# Patient Record
Sex: Female | Born: 1955 | Race: White | Hispanic: No | Marital: Married | Smoking: Never smoker
Health system: Southern US, Community
[De-identification: ages and names within clinical notes are randomized; demographics above are authoritative.]

## PROBLEM LIST (undated history)

## (undated) DIAGNOSIS — M1990 Unspecified osteoarthritis, unspecified site: Secondary | ICD-10-CM

## (undated) DIAGNOSIS — R079 Chest pain, unspecified: Secondary | ICD-10-CM

## (undated) DIAGNOSIS — E785 Hyperlipidemia, unspecified: Secondary | ICD-10-CM

## (undated) DIAGNOSIS — R51 Headache: Secondary | ICD-10-CM

## (undated) DIAGNOSIS — R002 Palpitations: Secondary | ICD-10-CM

## (undated) DIAGNOSIS — K589 Irritable bowel syndrome without diarrhea: Secondary | ICD-10-CM

## (undated) DIAGNOSIS — M8580 Other specified disorders of bone density and structure, unspecified site: Secondary | ICD-10-CM

## (undated) DIAGNOSIS — IMO0001 Reserved for inherently not codable concepts without codable children: Secondary | ICD-10-CM

## (undated) HISTORY — DX: Palpitations: R00.2

## (undated) HISTORY — DX: Other specified disorders of bone density and structure, unspecified site: M85.80

## (undated) HISTORY — DX: Headache: R51

## (undated) HISTORY — DX: Chest pain, unspecified: R07.9

## (undated) HISTORY — DX: Irritable bowel syndrome, unspecified: K58.9

## (undated) HISTORY — DX: Hyperlipidemia, unspecified: E78.5

## (undated) HISTORY — DX: Reserved for inherently not codable concepts without codable children: IMO0001

---

## 1990-06-07 HISTORY — PX: TONSILLECTOMY: SHX5217

## 1992-06-07 HISTORY — PX: KNEE ARTHROSCOPY: SHX127

## 1997-12-16 ENCOUNTER — Other Ambulatory Visit: Admission: RE | Admit: 1997-12-16 | Discharge: 1997-12-16 | Payer: Self-pay | Admitting: Obstetrics and Gynecology

## 1999-09-01 ENCOUNTER — Encounter: Payer: Self-pay | Admitting: Family Medicine

## 1999-09-01 ENCOUNTER — Encounter: Admission: RE | Admit: 1999-09-01 | Discharge: 1999-09-01 | Payer: Self-pay | Admitting: Family Medicine

## 1999-12-16 ENCOUNTER — Other Ambulatory Visit: Admission: RE | Admit: 1999-12-16 | Discharge: 1999-12-16 | Payer: Self-pay | Admitting: Obstetrics and Gynecology

## 2001-01-20 ENCOUNTER — Other Ambulatory Visit: Admission: RE | Admit: 2001-01-20 | Discharge: 2001-01-20 | Payer: Self-pay | Admitting: Obstetrics and Gynecology

## 2003-03-20 ENCOUNTER — Other Ambulatory Visit: Admission: RE | Admit: 2003-03-20 | Discharge: 2003-03-20 | Payer: Self-pay | Admitting: Obstetrics and Gynecology

## 2004-10-02 ENCOUNTER — Other Ambulatory Visit: Admission: RE | Admit: 2004-10-02 | Discharge: 2004-10-02 | Payer: Self-pay | Admitting: Obstetrics and Gynecology

## 2004-11-11 ENCOUNTER — Ambulatory Visit: Payer: Self-pay | Admitting: Internal Medicine

## 2004-12-11 ENCOUNTER — Encounter (INDEPENDENT_AMBULATORY_CARE_PROVIDER_SITE_OTHER): Payer: Self-pay | Admitting: *Deleted

## 2004-12-11 ENCOUNTER — Ambulatory Visit: Payer: Self-pay | Admitting: Internal Medicine

## 2004-12-11 ENCOUNTER — Encounter: Payer: Self-pay | Admitting: Gastroenterology

## 2007-03-08 LAB — CONVERTED CEMR LAB: Pap Smear: NORMAL

## 2008-04-07 DIAGNOSIS — IMO0001 Reserved for inherently not codable concepts without codable children: Secondary | ICD-10-CM

## 2008-04-07 HISTORY — DX: Reserved for inherently not codable concepts without codable children: IMO0001

## 2008-04-16 ENCOUNTER — Ambulatory Visit: Payer: Self-pay | Admitting: Internal Medicine

## 2008-04-16 DIAGNOSIS — R51 Headache: Secondary | ICD-10-CM | POA: Insufficient documentation

## 2008-04-16 DIAGNOSIS — M949 Disorder of cartilage, unspecified: Secondary | ICD-10-CM

## 2008-04-16 DIAGNOSIS — R519 Headache, unspecified: Secondary | ICD-10-CM | POA: Insufficient documentation

## 2008-04-16 DIAGNOSIS — M899 Disorder of bone, unspecified: Secondary | ICD-10-CM | POA: Insufficient documentation

## 2008-04-22 ENCOUNTER — Telehealth: Payer: Self-pay | Admitting: Internal Medicine

## 2008-04-24 ENCOUNTER — Ambulatory Visit (HOSPITAL_BASED_OUTPATIENT_CLINIC_OR_DEPARTMENT_OTHER): Admission: RE | Admit: 2008-04-24 | Discharge: 2008-04-24 | Payer: Self-pay | Admitting: Internal Medicine

## 2008-04-24 ENCOUNTER — Telehealth (INDEPENDENT_AMBULATORY_CARE_PROVIDER_SITE_OTHER): Payer: Self-pay | Admitting: *Deleted

## 2008-05-14 ENCOUNTER — Ambulatory Visit: Payer: Self-pay | Admitting: Internal Medicine

## 2008-05-15 LAB — CONVERTED CEMR LAB
Albumin: 3.8 g/dL (ref 3.5–5.2)
Alkaline Phosphatase: 43 units/L (ref 39–117)
BUN: 12 mg/dL (ref 6–23)
Basophils Relative: 0.2 % (ref 0.0–3.0)
Calcium: 9 mg/dL (ref 8.4–10.5)
Creatinine, Ser: 0.6 mg/dL (ref 0.4–1.2)
Direct LDL: 193.6 mg/dL
Eosinophils Absolute: 0.1 10*3/uL (ref 0.0–0.7)
Eosinophils Relative: 1.7 % (ref 0.0–5.0)
GFR calc Af Amer: 134 mL/min
GFR calc non Af Amer: 111 mL/min
Glucose, Bld: 89 mg/dL (ref 70–99)
HCT: 35.3 % — ABNORMAL LOW (ref 36.0–46.0)
Hemoglobin: 12.1 g/dL (ref 12.0–15.0)
MCV: 96.7 fL (ref 78.0–100.0)
Monocytes Absolute: 0.4 10*3/uL (ref 0.1–1.0)
Monocytes Relative: 9.7 % (ref 3.0–12.0)
Neutro Abs: 1.5 10*3/uL (ref 1.4–7.7)
Platelets: 193 10*3/uL (ref 150–400)
Total CHOL/HDL Ratio: 3.5
Total Protein: 6.5 g/dL (ref 6.0–8.3)
Triglycerides: 99 mg/dL (ref 0–149)
WBC: 3.8 10*3/uL — ABNORMAL LOW (ref 4.5–10.5)

## 2008-05-21 ENCOUNTER — Ambulatory Visit: Payer: Self-pay | Admitting: Internal Medicine

## 2008-05-21 DIAGNOSIS — E785 Hyperlipidemia, unspecified: Secondary | ICD-10-CM | POA: Insufficient documentation

## 2008-05-21 DIAGNOSIS — D649 Anemia, unspecified: Secondary | ICD-10-CM | POA: Insufficient documentation

## 2008-08-04 ENCOUNTER — Ambulatory Visit: Payer: Self-pay | Admitting: Occupational Medicine

## 2008-08-12 ENCOUNTER — Ambulatory Visit: Payer: Self-pay | Admitting: Internal Medicine

## 2008-08-12 DIAGNOSIS — H6120 Impacted cerumen, unspecified ear: Secondary | ICD-10-CM | POA: Insufficient documentation

## 2008-09-03 ENCOUNTER — Ambulatory Visit: Payer: Self-pay | Admitting: Internal Medicine

## 2008-09-03 LAB — CONVERTED CEMR LAB
Cholesterol: 287 mg/dL — ABNORMAL HIGH (ref 0–200)
Direct LDL: 191.8 mg/dL
Iron: 107 ug/dL (ref 42–145)
Saturation Ratios: 29.7 % (ref 20.0–50.0)
Total CHOL/HDL Ratio: 4
Transferrin: 257.3 mg/dL (ref 212.0–360.0)
Triglycerides: 131 mg/dL (ref 0.0–149.0)

## 2008-09-20 ENCOUNTER — Ambulatory Visit: Payer: Self-pay | Admitting: Internal Medicine

## 2009-03-12 ENCOUNTER — Emergency Department (HOSPITAL_COMMUNITY): Admission: EM | Admit: 2009-03-12 | Discharge: 2009-03-12 | Payer: Self-pay | Admitting: Emergency Medicine

## 2009-05-20 ENCOUNTER — Encounter (INDEPENDENT_AMBULATORY_CARE_PROVIDER_SITE_OTHER): Payer: Self-pay | Admitting: Obstetrics and Gynecology

## 2009-05-20 ENCOUNTER — Ambulatory Visit (HOSPITAL_COMMUNITY): Admission: RE | Admit: 2009-05-20 | Discharge: 2009-05-20 | Payer: Self-pay | Admitting: Obstetrics and Gynecology

## 2009-05-20 ENCOUNTER — Ambulatory Visit: Payer: Self-pay | Admitting: Vascular Surgery

## 2009-06-24 ENCOUNTER — Ambulatory Visit: Payer: Self-pay | Admitting: Diagnostic Radiology

## 2009-06-24 ENCOUNTER — Emergency Department (HOSPITAL_BASED_OUTPATIENT_CLINIC_OR_DEPARTMENT_OTHER): Admission: EM | Admit: 2009-06-24 | Discharge: 2009-06-24 | Payer: Self-pay | Admitting: Emergency Medicine

## 2009-07-15 ENCOUNTER — Encounter: Admission: RE | Admit: 2009-07-15 | Discharge: 2009-09-23 | Payer: Self-pay | Admitting: Internal Medicine

## 2009-11-11 ENCOUNTER — Encounter: Payer: Self-pay | Admitting: Internal Medicine

## 2009-11-18 ENCOUNTER — Encounter (INDEPENDENT_AMBULATORY_CARE_PROVIDER_SITE_OTHER): Payer: Self-pay | Admitting: *Deleted

## 2010-01-02 ENCOUNTER — Telehealth: Payer: Self-pay | Admitting: Internal Medicine

## 2010-02-24 ENCOUNTER — Ambulatory Visit: Payer: Self-pay | Admitting: Internal Medicine

## 2010-02-24 DIAGNOSIS — R198 Other specified symptoms and signs involving the digestive system and abdomen: Secondary | ICD-10-CM | POA: Insufficient documentation

## 2010-02-24 DIAGNOSIS — R0602 Shortness of breath: Secondary | ICD-10-CM | POA: Insufficient documentation

## 2010-02-24 DIAGNOSIS — M2550 Pain in unspecified joint: Secondary | ICD-10-CM | POA: Insufficient documentation

## 2010-02-24 DIAGNOSIS — R197 Diarrhea, unspecified: Secondary | ICD-10-CM | POA: Insufficient documentation

## 2010-02-27 ENCOUNTER — Ambulatory Visit: Payer: Self-pay | Admitting: Diagnostic Radiology

## 2010-02-27 ENCOUNTER — Telehealth: Payer: Self-pay | Admitting: Internal Medicine

## 2010-02-27 ENCOUNTER — Ambulatory Visit (HOSPITAL_BASED_OUTPATIENT_CLINIC_OR_DEPARTMENT_OTHER): Admission: RE | Admit: 2010-02-27 | Discharge: 2010-02-27 | Payer: Self-pay | Admitting: Internal Medicine

## 2010-03-03 ENCOUNTER — Encounter: Payer: Self-pay | Admitting: Internal Medicine

## 2010-03-03 LAB — CONVERTED CEMR LAB
Albumin: 4.7 g/dL (ref 3.5–5.2)
Anti Nuclear Antibody(ANA): POSITIVE — AB
BUN: 10 mg/dL (ref 6–23)
Basophils Absolute: 0 10*3/uL (ref 0.0–0.1)
Basophils Relative: 0 % (ref 0–1)
Calcium: 9.6 mg/dL (ref 8.4–10.5)
Eosinophils Absolute: 0.1 10*3/uL (ref 0.0–0.7)
Free T4: 0.99 ng/dL (ref 0.80–1.80)
Glucose, Bld: 90 mg/dL (ref 70–99)
Hemoglobin: 13.2 g/dL (ref 12.0–15.0)
MCHC: 33.2 g/dL (ref 30.0–36.0)
MCV: 98.3 fL (ref 78.0–100.0)
Monocytes Absolute: 0.4 10*3/uL (ref 0.1–1.0)
Monocytes Relative: 9 % (ref 3–12)
Neutrophils Relative %: 45 % (ref 43–77)
RBC: 4.04 M/uL (ref 3.87–5.11)
RDW: 12.5 % (ref 11.5–15.5)
Rhuematoid fact SerPl-aCnc: <20 intl units/mL (ref 0–20)
TSH: 1.722 microintl units/mL (ref 0.350–4.500)
Total Bilirubin: 0.6 mg/dL (ref 0.3–1.2)

## 2010-03-06 ENCOUNTER — Telehealth: Payer: Self-pay | Admitting: Internal Medicine

## 2010-03-16 ENCOUNTER — Encounter: Payer: Self-pay | Admitting: Internal Medicine

## 2010-04-02 ENCOUNTER — Ambulatory Visit: Payer: Self-pay | Admitting: Internal Medicine

## 2010-05-19 ENCOUNTER — Encounter: Payer: Self-pay | Admitting: Nurse Practitioner

## 2010-05-22 ENCOUNTER — Telehealth: Payer: Self-pay | Admitting: Internal Medicine

## 2010-05-22 ENCOUNTER — Encounter: Payer: Self-pay | Admitting: Nurse Practitioner

## 2010-05-26 ENCOUNTER — Ambulatory Visit: Payer: Self-pay | Admitting: Gastroenterology

## 2010-05-26 DIAGNOSIS — R141 Gas pain: Secondary | ICD-10-CM | POA: Insufficient documentation

## 2010-05-26 DIAGNOSIS — R143 Flatulence: Secondary | ICD-10-CM

## 2010-05-26 DIAGNOSIS — R142 Eructation: Secondary | ICD-10-CM

## 2010-06-03 ENCOUNTER — Ambulatory Visit
Admission: RE | Admit: 2010-06-03 | Discharge: 2010-06-03 | Payer: Self-pay | Source: Home / Self Care | Attending: Family | Admitting: Family

## 2010-06-29 DIAGNOSIS — Z860100 Personal history of colon polyps, unspecified: Secondary | ICD-10-CM | POA: Insufficient documentation

## 2010-06-29 DIAGNOSIS — K5730 Diverticulosis of large intestine without perforation or abscess without bleeding: Secondary | ICD-10-CM | POA: Insufficient documentation

## 2010-07-03 ENCOUNTER — Ambulatory Visit
Admission: RE | Admit: 2010-07-03 | Discharge: 2010-07-03 | Payer: Self-pay | Source: Home / Self Care | Attending: Internal Medicine | Admitting: Internal Medicine

## 2010-07-06 ENCOUNTER — Other Ambulatory Visit: Payer: Self-pay | Admitting: Internal Medicine

## 2010-07-06 DIAGNOSIS — R140 Abdominal distension (gaseous): Secondary | ICD-10-CM

## 2010-07-06 DIAGNOSIS — R1013 Epigastric pain: Secondary | ICD-10-CM

## 2010-07-07 NOTE — Progress Notes (Signed)
Summary: triage  Phone Note Call from Patient Call back at [phone]   Caller: Patient Call For: Dr. Juanda Chance Reason for Call: Talk to Nurse Summary of Call: would like to be worked in for abd pain... ok to see Amy or Gunnar Fusi Initial call taken by: Vallarie Mare,  January 02, 2010 12:10 PM  Follow-up for Phone Call        Pt c/o abd off and on for one month.  Worse this week.  Would like to be seen.  APpt sch for pt to see Amy Esterwood on 01/07/10. Follow-up by: Ashok Cordia RN,  January 02, 2010 2:59 PM

## 2010-07-07 NOTE — Letter (Signed)
Summary: [facility]  [facility]   Imported By: Lanelle Bal 12/03/2009 08:50:06  _____________________________________________________________________  External Attachment:    Type:   Image     Comment:   External Document

## 2010-07-07 NOTE — Assessment & Plan Note (Signed)
Summary: fatigue short of breath, cough/mhf--Rm 3   Vital Signs:  Patient profile:   55 year old female Height:      64 inches Weight:      142.50 pounds BMI:     24.55 O2 Sat:      99 % on Room air Temp:     98.2 degrees F oral Pulse rate:   66 / minute Pulse rhythm:   regular Resp:     16 per minute BP sitting:   122 / 80  (right arm) Cuff size:   regular  Vitals Entered By: Mervin Kung CMA Duncan Dull) (February 24, 2010 3:19 PM)  O2 Flow:  Room air CC: Rm 3  Pt states she is having intermittent shorness of breath and fatigue x 2 months. Is Patient Diabetic? No   Primary Care Provider:  Dondra Spry DO  CC:  Rm 3  Pt states she is having intermittent shorness of breath and fatigue x 2 months..  History of Present Illness: 55 y/o white female with multiple complaints over the last 2 months -  55 feels poorly  dry cough,  can't get deep breath "feeling mild pressure" she thought breathing issue was related to abd issues  some indigestion - which is better stopped all vitamins intermittent diarrhea.  when she has diarrhea - it can be 6 x per day she can exp rectal irritation even after she finishes BM, she feels like she has to go again knot in her throat,  no overt heartburn a couple of times,  she noticed "vapor coming out of her mouth" always feels full tried prilosec x 2 weeks -  fullness got better  over the winter - noticed rash on left side of neck while experiencing diarrhea.   Current Diet Breakfast: higher fiber cereral fruit, milk   boiled egg,   Lunch:  salad, with [place] meat,  mandarin oranged DInner:  salmon, broccoli and rice.   pizza 2 times per month Snacks: trail mix Beverage:  mostly water,  green tea with stevia, hot tea at night   Preventive Screening-Counseling & Management  Alcohol-Tobacco     Smoking Status: never  Allergies: 1)  ! Sulfa  Past History:  Past Medical History: Headache Normal MRI of Brain - 04/2008 Osteopenia     elevated cholesterol decreased vit d   Past Surgical History: Left knee orthoscopic surgery 1994 Tonsillectomy 1992    FH reviewed for relevance  Family History: Hypertension-mother Fibromyalgia-mother Osteoarthritis-mother Graves' disease- paternal aunt Father side the family with history of autoimmune diseases. brother alive and healthy     Review of Systems  The patient denies fever.         pt c/o generalized achy joints no oral ulcers no facial rash  Physical Exam  General:  alert, well-developed, and well-nourished.   Head:  normocephalic and atraumatic.   Eyes:  pupils equal, pupils round, and pupils reactive to light.   Ears:  R ear normal and L ear normal.   Mouth:  pharynx pink and moist.  no oral lesions Neck:  supple and no masses.  no adenopathy Lungs:  normal respiratory effort and normal breath sounds.   Heart:  normal rate, regular rhythm, and no gallop.   Abdomen:  diffuse tenderness,  soft, no masses, no hepatomegaly, and no splenomegaly.   Msk:  normal ROM, no joint swelling, no joint warmth, and no redness over joints.   Extremities:  No lower extremity edema Neurologic:  cranial nerves  II-XII intact and gait normal.   Psych:  normally interactive, good eye contact, not anxious appearing, and not depressed appearing.     Impression & Recommendations:  Problem # 1:  SHORTNESS OF BREATH (ICD-786.05) SOB of unclear etiology .  check labs. if initial testing is normal.  consider check PFTs Orders: T-Basic Metabolic Panel [id number]) T-CBC w/Diff [id number]) T-2 View CXR, Same Day (71020.5TC) T-BNP  (B Natriuretic Peptide) ([id number]) EKG w/ Interpretation (93000)  Problem # 2:  DIARRHEA (ICD-787.91) chronic intermittent loose stools.  I doubt IBD. refer to GI for further eval Orders: T-Hepatic Function [id number]) T-Sed Rate (Automated) ([phone] [phone]) T-TSH [phone]) T-T4, Free [id number]) Gastroenterology Referral  (GI)  Problem # 3:  ARTHRALGIA (ICD-719.40) Assessment: New  Orders: T-Antinuclear Antib (ANA) [phone]) T-Rheumatoid Factor [id number])  Problem # 4:  EARLY SATIETY (ICD-789.9)  Orders: Gastroenterology Referral (GI)  Complete Medication List: 1)  Calcium/vit D  .... 1200/1000 two times a day 2)  Vitamin E  .... 1,000 mg once a day 3)  Omega 3.69  .... 1000mg  once a day 4)  Estradiol 0.5 Mg Tabs (Estradiol) .... Take 1 tablet by mouth once a day. 5)  Prometrium 100 Mg Caps (Progesterone micronized) .... Take 1 tab by mouth at bedtime. 6)  Mobic 15 Mg Tabs (Meloxicam) .... As needed. 7)  Omeprazole 40 Mg Cpdr (Omeprazole) .... One by mouth once daily 15-20 minutes before am meal 8)  Welchol 625 Mg Tabs (Colesevelam hcl) .... One by mouth three times a day as needed diarrhea  Patient Instructions: 1)  Please schedule a follow-up appointment in 4 weeks. 2)  Decrease caffeine intake Prescriptions: OMEPRAZOLE 40 MG CPDR (OMEPRAZOLE) one by mouth once daily 15-20 minutes before AM meal  #30 x 3   Entered and Authorized by:   D. Thomos Lemons DO   Signed by:   D. Thomos Lemons DO on 02/24/2010   Method used:   Electronically to        [id number] (retail)       [address] [place], [place]  [address]       Ph: [phone]       Fax: [phone]   RxID:   [id number]   Current Allergies (reviewed today): ! SULFA

## 2010-07-07 NOTE — Assessment & Plan Note (Signed)
Summary: 1 month follow up/mhf   Vital Signs:  Patient profile:   55 year old female Height:      64 inches Weight:      140.50 pounds BMI:     24.20 O2 Sat:      99 % on Room air Temp:     97.9 degrees F oral Pulse rate:   59 / minute Pulse rhythm:   regular Resp:     16 per minute BP sitting:   116 / 70  (right arm) Cuff size:   regular  Vitals Entered By: Glendell Docker CMA (April 02, 2010 3:01 PM)  O2 Flow:  Room air CC: 1 Month Follow up  Is Patient Diabetic? No Pain Assessment Patient in pain? no      Comments discuss vitamin D, and rheumatology visit, flu shot to be given with employer   Primary Care Megon Kalina:  D. Thomos Lemons DO  CC:  1 Month Follow up .  History of Present Illness: 55 year old white female for followup.  Patient seen by rheumatology due to  positive ANA and multiple arthralgias. Patient reports rheumatologic workup negative Her symptoms improved with eliminating gluten from her diet She was tested for celiac sprue, her antibodies were negative She declines referral for EGD for small bowel biopsy  she also reported dry eyes and dry mouth Sjgren's antibodies were negative  Preventive Screening-Counseling & Management  Alcohol-Tobacco     Smoking Status: never  Allergies: 1)  ! Sulfa  Past History:  Past Medical History: Headache Normal MRI of Brain - 04/2008 Osteopenia    elevated cholesterol low vit d   Past Surgical History: Left knee orthoscopic surgery 1994 Tonsillectomy 1992      Family History: Hypertension-mother Fibromyalgia-mother Osteoarthritis-mother Graves' disease- paternal aunt Father side the family with history of autoimmune diseases. 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   Lungs:  normal respiratory effort and normal breath sounds.   Heart:  normal rate, regular rhythm, and no gallop.   Neurologic:  cranial nerves II-XII intact and gait normal.     Impression & Recommendations:  Problem # 1:  DIARRHEA (ICD-787.91) Assessment Improved pt reports loose stools improved after starting gluten free diet.  pt seen by rheum.  sprue antibodies reported negative  Problem # 2:  ARTHRALGIA (ICD-719.40) Assessment: Improved arthralgia also improved with gluten free diet. she defers EGD for small bowel biopsy  Complete Medication List: 1)  Calcium/vit D  .... 1200/1000 two times a day 2)  Estradiol 0.5 Mg Tabs (Estradiol) .... Take 1 tablet by mouth once a day. 3)  Prometrium 100 Mg Caps (Progesterone micronized) .... Take 1 tab by mouth at bedtime. 4)  Mobic 15 Mg Tabs (Meloxicam) .... As needed.  Patient Instructions: 1)  Please schedule a follow-up appointment in 1 year.   Orders Added: 1)  Est. Patient Level III [14782]    Current Allergies (reviewed today): ! SULFA    Preventive Care Screening  Bone Density:    Date:  07/21/2009    Results:  abnormal std dev  Mammogram:    Date:  07/21/2009    Results:  normal   Pap Smear:    Date:  05/26/2009  Results:  normal

## 2010-07-07 NOTE — Consult Note (Signed)
Summary: [facility]   Imported By: Maryln Gottron 04/03/2010 09:50:01  _____________________________________________________________________  External Attachment:    Type:   Image     Comment:   External Document

## 2010-07-07 NOTE — Letter (Signed)
Summary: Colonoscopy Date Change Letter  [facility]  [address] [place], [place] [address]   Phone: [phone]  Fax: [phone]      November 18, 2009 MRN: [id number]   [facility] [place] [facility] [address] [place], [place]  [address]   Dear Ms. Pracht,   Previously you were recommended to have a repeat colonoscopy around this time. Your chart was recently reviewed by Dr. Hedwig Morton. Juanda Chance of [facility]. Follow up colonoscopy is now recommended in July 2013. This revised recommendation is based on current, nationally recognized guidelines for colorectal cancer screening and polyp surveillance. These guidelines are endorsed by the American Cancer Society, [employer] on Colorectal Cancer as well as numerous other major medical organizations.  Please understand that our recommendation assumes that you do not have any new symptoms such as bleeding, a change in bowel habits, anemia, or significant abdominal discomfort. If you do have any concerning GI symptoms or want to discuss the guideline recommendations, please call to arrange an office visit at your earliest convenience. Otherwise we will keep you in our reminder system and contact you 1-2 months prior to the date listed above to schedule your next colonoscopy.  Thank you,  Hedwig Morton. Juanda Chance, M.D.  [facility] [phone]

## 2010-07-07 NOTE — Progress Notes (Signed)
Summary: Xray results  Phone Note Outgoing Call   Summary of Call: call pt - chest x ray is normal Initial call taken by: D. Thomos Lemons DO,  February 27, 2010 4:23 PM  Follow-up for Phone Call        call placed to patient  [phone], she hs been advised per Dr Artist Pais instructions Follow-up by: Glendell Docker CMA,  February 27, 2010 5:02 PM

## 2010-07-07 NOTE — Progress Notes (Signed)
Summary: Lab Results  Phone Note Outgoing Call   Summary of Call: call pt - blood work shows ANA is positive.  I suggest referral to rheumatologist Initial call taken by: D. Thomos Lemons DO,  March 06, 2010 1:20 PM  Follow-up for Phone Call        call placed to patient at [phone], she has been advised per Dr Artist Pais instructions Follow-up by: Glendell Docker CMA,  March 06, 2010 1:53 PM

## 2010-07-09 NOTE — Assessment & Plan Note (Signed)
Summary: CONGESTION SORE THROAT/MHF--Rm 3   Vital Signs:  Patient profile:   54 year old female Height:      64 inches Weight:      140.25 pounds BMI:     24.16 O2 Sat:      100 % on Room air Temp:     97.0 degrees F oral Pulse rate:   66 / minute Pulse rhythm:   regular Resp:     18 per minute BP sitting:   130 / 88  (right arm) Cuff size:   regular  Vitals Entered By: Mervin Kung CMA Duncan Dull) (June 03, 2010 9:44 AM)  O2 Flow:  Room air CC: Pt states she has had sore throat and sinus drainage since Monday. Has tried throat lozenges and salt water gargle without relief. Is Patient Diabetic? No Pain Assessment Patient in pain? no      Comments Pt states she is now on Vit D 50,000 units once a week x 6 weeks. Nicki Guadalajara Fergerson CMA Duncan Dull)  June 03, 2010 9:50 AM    Primary Care Provider:  Dondra Spry DO  CC:  Pt states she has had sore throat and sinus drainage since Monday. Has tried throat lozenges and salt water gargle without relief..  History of Present Illness: Pt is a 55 year old female who presents with complaing of sore throat x 48 hours.  She also notes + nasal drainage since last night- clear. Feels achey.  Mild cough- worse at night.  Has taken zinc lozenges, salt water gargles without improvement.   Throat feels raw.  Denies HA or ear pain.  Denies known sick contacts at home but had some sick contacts at work.    Allergies: 1)  ! Sulfa  Past History:  Past Medical History: Last updated: 04/02/2010 Headache Normal MRI of Brain - 04/2008 Osteopenia    elevated cholesterol low vit d   Past Surgical History: Last updated: 04/02/2010 Left knee orthoscopic surgery 1994 Tonsillectomy 1992      Review of Systems       see HPI  Physical Exam  General:  Well-developed,well-nourished,in no acute distress; alert,appropriate and cooperative throughout examination Head:  Normocephalic and atraumatic without obvious abnormalities. No apparent  alopecia or balding. Ears:  External ear exam shows no significant lesions or deformities.  Otoscopic examination reveals partially occluded canals due to cerumen, tympanic membranes are intact bilaterally without bulging, retraction, inflammation or discharge. Hearing is grossly normal bilaterally. Mouth:  tonsils are surgically absent.  Mild erythema without exudates noted Neck:  No deformities, masses, or tenderness noted. Lungs:  Normal respiratory effort, chest expands symmetrically. Lungs are clear to auscultation, no crackles or wheezes. Heart:  Normal rate and regular rhythm. S1 and S2 normal without gallop, murmur, click, rub or other extra sounds. Cervical Nodes:  no anterior cervical adenopathy.     Impression & Recommendations:  Problem # 1:  VIRAL URI (ICD-465.9) Assessment New Rapid strep is negative.  Symptoms most consistent with viral URI.  Recommended supportive measures as outlined in pt sign out sheet.  Pt instructed not to use motrin and mobic together.  She verbalizes understanding.  Her updated medication list for this problem includes:    Mobic 15 Mg Tabs (Meloxicam) .Marland Kitchen... As needed.  Complete Medication List: 1)  Calcium/vit D  .... 1200/1000 two times a day 2)  Estradiol 0.5 Mg Tabs (Estradiol) .... Take 1 tablet by mouth once a day. 3)  Prometrium 100 Mg Caps (Progesterone micronized) .Marland KitchenMarland KitchenMarland Kitchen  Take 1 tab by mouth at bedtime. 4)  Mobic 15 Mg Tabs (Meloxicam) .... As needed. 5)  Align Caps (Probiotic product) .... Take 1 capsule daily  Patient Instructions: 1)  Gargle twice daily with salt water. 2)  Take Ibuprofen every 6 hours as needed for pain 3)  You may use over the counter Cepacol lozenges or Chloraseptic spray as needed for sore throat. 4)  Call if your symptoms worsen, or if you are not feeling better in 48-72 hours.   Orders Added: 1)  Est. Patient Level III [64403]    Current Allergies (reviewed today): ! SULFA  Appended Document: CONGESTION SORE  THROAT/MHF--Rm 3    Clinical Lists Changes  Orders: Added new Service order of Rapid Strep [phone]) - Signed Observations: Added new observation of RAPID STREP: negative (06/03/2010 10:23)      Laboratory Results   Date/Time Reported: Mervin Kung CMA (AAMA)  June 03, 2010 10:23 AM   Other Tests  Rapid Strep: negative  Kit Test Internal QC: Positive   (Normal Range: Negative)

## 2010-07-09 NOTE — Procedures (Signed)
Summary: COLON   Colonoscopy  Procedure date:  12/11/2004  Findings:      Location:  [facility].    Procedures Next Due Date:    Colonoscopy: 12/2009 Patient Name: Brooke Bright, Brooke Bright MRN:  Procedure Procedures: Colonoscopy CPT: 09811.    with biopsy. CPT: Q5068410.  Personnel: Endoscopist: Zynasia Burklow L. Juanda Chance, MD.  Referred By: Richardean Chimera, MD.  Exam Location: Exam performed in Outpatient Clinic. Outpatient  Patient Consent: Procedure, Alternatives, Risks and Benefits discussed, consent obtained, from patient. Consent was obtained by the RN.  Indications  Average Risk Screening Routine.  History  Current Medications: Patient is not currently taking Coumadin.  Pre-Exam Physical: Performed Dec 11, 2004. Entire physical exam was normal.  Exam Exam: Extent of exam reached: Cecum, extent intended: Cecum.  The cecum was identified by appendiceal orifice and IC valve. Colon retroflexion performed. Images taken. ASA Classification: I. Tolerance: good.  Monitoring: Pulse and BP monitoring, Oximetry used. Supplemental O2 given.  Colon Prep Used Miralax for colon prep. Prep results: good.  Sedation Meds: Patient assessed and found to be appropriate for moderate (conscious) sedation. Fentanyl 100 mcg. given IV. Versed 10 mg. given IV.  Findings - NORMAL EXAM: Cecum.  - DIVERTICULOSIS: Sigmoid Colon. ICD9: Diverticulosis: 562.10. Comments: several diverticuli seen.  POLYP: Rectum, Maximum size: 3 mm. diminutive, sessile polyp. Distance from Anus 5 cm. Procedure:  biopsy without cautery, removed, retrieved, Polyp sent to pathology. ICD9: Colon Polyps: 211.3.  - NORMAL EXAM: Rectum.   Assessment Abnormal examination, see findings above.  Diagnoses: 211.3: Colon Polyps.  562.10: Diverticulosis.   Comments: diminutive polyp ablated Events  Unplanned Interventions: No intervention was required.  Unplanned Events: There were no  complications. Plans Medication Plan: Await pathology.  Patient Education: Patient given standard instructions for: Yearly hemoccult testing recommended. Patient instructed to get routine colonoscopy every 5-7 years.  Comments: recall colonoscopy interval will depend on the polyp pathology ( hyperplastic vs other) Disposition: After procedure patient sent to recovery. After recovery patient sent home.   This report was created from the original endoscopy report, which was reviewed and signed by the above listed endoscopist.

## 2010-07-09 NOTE — Assessment & Plan Note (Signed)
Summary: frequent BMs, loose stool/Brooke Bright   History of Present Illness Visit Type: Initial Consult Primary GI MD: Melvia Heaps MD [facility] Primary Provider: Dondra Spry DO Requesting Provider: Cathlean Sauer, MD Chief Complaint: Intermittant episodes of watery loose diarrhea. Pt stopped eating gluten food products and this has helped with her diarrhea. Pt states with the episodes she has heartburn, reflux and anal discomfort.  History of Present Illness:   Brooke Bright is a 55 year old female who had a screening colonoscopy with Dr. Arlyce Dice in 2006. Brooke Bright believes she has celiac disease and would like to discuss diagnosis and treatment. Brooke Bright descrbes a several year history of intermittent bowel habit changes. During these "bouts" her stool consistency varies from  loose to more of a "cow patty". Last summer Brooke Bright developed several problems including early satiety, feeling of a lump in her throat, pruritis /upper body rashes, fatigue and polyarthralgias.  She was found to have a positive ANA and was referred to a rheumatologist. Her rheumatology workup was negative but suggestion was made to try a gluten-free diet.  Brooke Bright had previously been tested for celiac disease by her GYN and labs were negative. After her discussion with rheumatology Brooke Bright decided to eliminate gluten from her diet and many of her symptoms subsided. Dr. Artist Pais gave her Prilosec which helped the early satiety and globus sensation. He recommended Brooke Bright see [place] for small bowel biopsies but Brooke Bright postponed making an appointment since she was feeling better.  Compared to last summer Brooke Bright feels a whole lot better but still has "bad days" when she feels bloatedhas burning in her rectum and a feeling of incomplete evacuation. Occasionally has RLQ pain and right lower back pain. She doesn't know if RLQ pain is referred pain from her back or vice versa.             GI Review of Systems    Reports abdominal pain, acid reflux,  bloating, and  chest pain.     Location of  Abdominal pain: right sided.    Denies dysphagia with liquids, dysphagia with solids, heartburn, loss of appetite, nausea, vomiting, vomiting blood, weight loss, and  weight gain.      Reports change in bowel habits, diarrhea, and  rectal pain.     Denies anal fissure, black tarry stools, constipation, diverticulosis, fecal incontinence, heme positive stool, hemorrhoids, irritable bowel syndrome, jaundice, light color stool, liver problems, and  rectal bleeding.    Current Medications (verified): 1)  Calcium/vit D .... 1200/1000 Two Times A Day 2)  Estradiol 0.5 Mg Tabs (Estradiol) .... Take 1 Tablet By Mouth Once A Day. 3)  Prometrium 100 Mg Caps (Progesterone Micronized) .... Take 1 Tab By Mouth At Bedtime. 4)  Mobic 15 Mg Tabs (Meloxicam) .... As Needed.  Allergies (verified): 1)  ! Sulfa  Past History:  Past Medical History: Reviewed history from 04/02/2010 and no changes required. Headache Normal MRI of Brain - 04/2008 Osteopenia    elevated cholesterol low vit d   Past Surgical History: Reviewed history from 04/02/2010 and no changes required. Left knee orthoscopic surgery 1994 Tonsillectomy 1992      Family History: Reviewed history from 04/02/2010 and no changes required. Hypertension-mother Fibromyalgia-mother Osteoarthritis-mother Graves' disease- paternal aunt Father side the family with history of autoimmune diseases. brother alive and healthy      Social History: Reviewed history from 04/02/2010 and no changes required. 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  The Brooke Bright denies allergy/sinus, anemia,  anxiety-new, blood in urine, breast changes/lumps, change in vision, confusion, cough, coughing up blood, depression-new, fainting, fever, headaches-new, hearing problems, heart murmur, heart rhythm changes, itching, menstrual pain, night sweats, nosebleeds, pregnancy symptoms, skin rash, sleeping problems, sore throat, swelling of feet/legs, swollen lymph glands, thirst - excessive , urination - excessive , urination changes/pain, urine leakage, vision changes, and voice change.    Vital Signs:  Brooke Bright profile:   55 year old female Height:      64 inches Weight:      139 pounds BMI:     23.95 Pulse rate:   66 / minute Pulse rhythm:   regular BP sitting:   126 / 74  (left arm) Cuff size:   regular  Vitals Entered By: Christie Nottingham CMA Duncan Dull) (May 26, 2010 3:32 PM)  Physical Exam  General:  Well developed, well nourished, no acute distress. Head:  Normocephalic and atraumatic. Eyes:  Conjunctiva pink, no icterus.  Mouth:  No oral lesions. Tongue moist.  Neck:  no obvious masses  Lungs:  Clear throughout to auscultation. Heart:  Regular rate and rhythm; no murmurs, rubs,  or bruits. Abdomen:  Abdomen soft, nontender, nondistended. No obvious masses or hepatomegaly.Normal bowel sounds.  Msk:  Symmetrical with no gross deformities. Normal posture. Extremities:  No clubbing, cyanosis, edema or deformities noted. Neurologic:  Alert and  oriented x4;  grossly normal neurologically. Skin:  Intact without significant lesions or rashes. Cervical Nodes:  No significant cervical adenopathy. Psych:  Alert and cooperative. Normal mood and affect.   Impression & Recommendations:  Problem # 1:  DIARRHEA (ICD-787.91) Loose stool, bloating, and fatigue, all markedly improved with gluten free diet. Apparently labs for celiac disease done by gynecologist were negative. Still symptomatic anywhere from 1-3 times a week despite strict gluten-free diet. She may have underlying IBS. Although  not absolutely necessary, Brooke Bright has no history of iron deficiency, her B12 and folate are normal, no significant weight loss.  She understands that management would be the same if she did in fact have celiac disease and that to be tested she would need to resume intake of gluten for at least 6 weeks. At this point in time Brooke Bright doesn't want to resume gluten for fear of having a recurrence of severe symptoms. If her symptoms progress Brooke Bright will further evaluation. Follow up with Dr. Juanda Chance in 4-5 weeks.  Problem # 2:  FLATULENCE-GAS-BLOATING (ICD-787.3) Assessment: Comment Only See #`1. Trial of Align  Brooke Bright Instructions: 1)  Take 1 Align capsule daily for 1 month. Samples given. 2)  We made you an appointment with Dr. Juanda Chance for 07-03-2010 at 3:30 PM.   3)  Copy sent to : D. Thomos Lemons, MD 4)  The medication list was reviewed and reconciled.  All changed / newly prescribed medications were explained.  A complete medication list was provided to the Brooke Bright / caregiver.

## 2010-07-09 NOTE — Letter (Signed)
Summary: New Patient letter  [facility]  [address] [place], [place] [address]   Phone: [phone]  Fax: [phone]       05/22/2010 MRN: [id number]  [facility] [address] [place], [place]  [address]  Dear [name],  Welcome to the Gastroenterology Division at [facility].    You are scheduled to see Brooke Cluster, RN on 05/26/10 at 3:30 PM on the 3rd floor at [facility], [address].  We ask that you try to arrive at our office 15 minutes prior to your appointment time to allow for check-in.  We would like you to complete the enclosed self-administered evaluation form prior to your visit and bring it with you on the day of your appointment.  We will review it with you.  Also, please bring a complete list of all your medications or, if you prefer, bring the medication bottles and we will list them.  Please bring your insurance card so that we may make a copy of it.  If your insurance requires a referral to see a specialist, please bring your referral form from your primary care physician.  Co-payments are due at the time of your visit and may be paid by cash, check or credit card.     Your office visit will consist of a consult with your physician (includes a physical exam), any laboratory testing he/she may order, scheduling of any necessary diagnostic testing (e.g. x-ray, ultrasound, CT-scan), and scheduling of a procedure (e.g. Endoscopy, Colonoscopy) if required.  Please allow enough time on your schedule to allow for any/all of these possibilities.    If you cannot keep your appointment, please call [phone] to cancel or reschedule prior to your appointment date.  This allows [place] the opportunity to schedule an appointment for another patient in need of care.  If you do not cancel or reschedule by 5 p.m. the business day prior to your appointment date, you will be charged a $50.00 late cancellation/no-show fee.    Thank you  for choosing [facility] for your medical needs.  We appreciate the opportunity to care for you.  Please visit [place] at our website  to learn more about our practice.                     Sincerely,                                                             The Gastroenterology Division

## 2010-07-09 NOTE — Letter (Signed)
Summary: [facility]   Imported By: Lester [place] 06/11/2010 09:45:49  _____________________________________________________________________  External Attachment:    Type:   Image     Comment:   External Document

## 2010-07-09 NOTE — Progress Notes (Signed)
Summary: triage  Phone Note From Other Clinic Call back at [phone]   Caller: Clydie Braun, scheduler Call For: Dr. Juanda Chance Reason for Call: Schedule Patient Appt Summary of Call: [name] would like pt seen for loose stools and frequent BMs... office notes being faxed to [facility] Initial call taken by: Vallarie Mare,  May 22, 2010 9:16 AM  Follow-up for Phone Call        Left message for Clydie Braun to call me to set up the appointment. Jesse Fall RN  May 22, 2010 9:58 AM Spoke with patient. Scheduled her on 05/26/10 at 3:30 PM with Willette Cluster, RNP.  Spoke with Clydie Braun at Dr. Lisbeth Ply office. She will fax patient records. .Letter mailed to patient. Follow-up by: Jesse Fall RN,  May 22, 2010 10:43 AM

## 2010-07-14 ENCOUNTER — Ambulatory Visit (HOSPITAL_COMMUNITY)
Admission: RE | Admit: 2010-07-14 | Discharge: 2010-07-14 | Disposition: A | Discharge status: Home / Self Care | Payer: 59 | Source: Ambulatory Visit | Attending: Internal Medicine | Admitting: Internal Medicine

## 2010-07-14 DIAGNOSIS — R140 Abdominal distension (gaseous): Secondary | ICD-10-CM

## 2010-07-14 DIAGNOSIS — R1013 Epigastric pain: Secondary | ICD-10-CM

## 2010-07-14 DIAGNOSIS — K838 Other specified diseases of biliary tract: Secondary | ICD-10-CM | POA: Insufficient documentation

## 2010-07-14 DIAGNOSIS — R142 Eructation: Secondary | ICD-10-CM | POA: Insufficient documentation

## 2010-07-14 DIAGNOSIS — R143 Flatulence: Secondary | ICD-10-CM | POA: Insufficient documentation

## 2010-07-14 DIAGNOSIS — R141 Gas pain: Secondary | ICD-10-CM | POA: Insufficient documentation

## 2010-07-14 DIAGNOSIS — K3189 Other diseases of stomach and duodenum: Secondary | ICD-10-CM | POA: Insufficient documentation

## 2010-07-15 ENCOUNTER — Telehealth: Payer: Self-pay | Admitting: Internal Medicine

## 2010-07-15 NOTE — Assessment & Plan Note (Signed)
Summary: F/U Frequent BM's, loose stools, saw Lafe Garin ACNP   History of Present Illness Primary GI MD: Lina Sar MD Primary Provider: Dondra Spry DO Requesting Provider: n/a Chief Complaint: f/u loose stools and bloating. Pt states she started Align which has helped with her bowels. Pt states her bowels have become less frequent and more solid but not completely better. History of Present Illness:   This is a 55 year old white female with questionable celiac disease. She had negative sprue serologies but has had a clinical response to a gluten-free diet. Her diarrhea has subsided on a strict gluten-free diet. She still has remaining dyspepsia and decreased energy. She saw Willette Cluster, NP in December 2012 and was given probiotics. She is interested in further evaluation, specifically an upper endoscopy with small bowel biopsy and possibly upper abdominal ultrasound to rule out symptomatic gallbladder disease.   GI Review of Systems    Reports bloating.      Denies abdominal pain, acid reflux, belching, chest pain, dysphagia with liquids, dysphagia with solids, heartburn, loss of appetite, nausea, vomiting, vomiting blood, weight loss, and  weight gain.      Reports diarrhea.     Denies anal fissure, black tarry stools, change in bowel habit, constipation, diverticulosis, fecal incontinence, heme positive stool, hemorrhoids, irritable bowel syndrome, jaundice, light color stool, liver problems, rectal bleeding, and  rectal pain.    Current Medications (verified): 1)  Calcium/vit D .... 1200/1000 Two Times A Day 2)  Estradiol 0.5 Mg Tabs (Estradiol) .... Take 1 Tablet By Mouth Once A Day. 3)  Prometrium 100 Mg Caps (Progesterone Micronized) .... Take 1 Tab By Mouth At Bedtime. 4)  Mobic 15 Mg Tabs (Meloxicam) .... As Needed. 5)  Align  Caps (Probiotic Product) .... Take 1 Capsule Daily  Allergies (verified): 1)  ! Sulfa  Past History:  Past Medical History: Reviewed history  from 04/02/2010 and no changes required. Headache Normal MRI of Brain - 04/2008 Osteopenia    elevated cholesterol low vit d   Past Surgical History: Reviewed history from 04/02/2010 and no changes required. Left knee orthoscopic surgery 1994 Tonsillectomy 1992      Family History: Reviewed history from 06/29/2010 and no changes required. Hypertension-mother Fibromyalgia-mother Osteoarthritis-mother Graves' disease- paternal aunt Father side the family with history of autoimmune diseases. brother alive and healthy     Family History of Heart Disease: Maternal Grandmother  Social History: Reviewed history from 06/29/2010 and no changes required. 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) Married  for 7 years (second marriage) one daughter 33 years old (college in [place]) Never Smoked Alcohol use-yes -occasionally  Review of Systems  The patient denies allergy/sinus, anemia, anxiety-new, arthritis/joint pain, back pain, blood in urine, breast changes/lumps, change in vision, confusion, cough, coughing up blood, depression-new, fainting, fatigue, fever, headaches-new, hearing problems, heart murmur, heart rhythm changes, itching, menstrual pain, muscle pains/cramps, night sweats, nosebleeds, pregnancy symptoms, shortness of breath, skin rash, sleeping problems, sore throat, swelling of feet/legs, swollen lymph glands, thirst - excessive , urination - excessive , urination changes/pain, urine leakage, vision changes, and voice change.         Pertinent positive and negative review of systems were noted in the above HPI. All other ROS was otherwise negative.   Vital Signs:  Patient profile:   55 year old female Height:      64 inches Weight:      137.25 pounds BMI:     23.64 Pulse rate:  68 / minute Pulse rhythm:   regular BP sitting:   106 / 64  (left arm) Cuff size:   regular  Vitals Entered By: Christie Nottingham CMA Duncan Dull) (July 03, 2010 4:37  PM)  Physical Exam  General:  Well developed, well nourished, no acute distress. Mouth:  tongue papillated Neck:  Supple; no masses or thyromegaly. Lungs:  Clear throughout to auscultation. Heart:  Regular rate and rhythm; no murmurs, rubs,  or bruits. Abdomen:  softer lax abdomen with normoactive bowel sounds. No tenderness. No distention. No abnormal rashes Extremities:  No clubbing, cyanosis, edema or deformities noted. Skin:  there are skin no lesions Psych:  Alert and cooperative. Normal mood and affect.    Impression & Recommendations:  Problem # 1:  COLONIC POLYPS, HYPERPLASTIC, HX OF (ICD-V12.72) Patient had an otherwise normal colonoscopy. A recall colonoscopy will be due in 2013.  Problem # 2:  FLATULENCE-GAS-BLOATING (ICD-787.3)  Patient will have an upper abdominal ultrasound to rule out symptomatic gallbladder disease.  Orders: Ultrasound Abdomen (UAS)  Problem # 3:  ANEMIA, MILD (ICD-285.9) Patient has anemia and osteopenia. We need to rule out sprue. I have asked her  to go back to a diet with gluten and we will then wait 6 weeks and obtain small bowel biopsies to definitively rule out celiac disease.  Patient Instructions: 1)  Stop gluten-free diet and start the a regular diet which includes gluten. In 6 weeks, we will schedule an upper endoscopy with small bowel biopsies. She will call us to schedule that. 2)  You will be scheduled for an upper abdominal ultrasound. 3)  Consider rechecking sprue serologies including tissue transglutaminase, IgA and IgG. 4)  The medication list was reviewed and reconciled.  All changed / newly prescribed medications were explained.  A complete medication list was provided to the patient / caregiver.

## 2010-07-23 NOTE — Progress Notes (Signed)
Summary: Returning your call  Phone Note Call from Patient Call back at Work Phone [phone]   Caller: Patient Call For: Dr. Juanda Chance Summary of Call: pt. said she was returning your call Initial call taken by: Karna Christmas,  July 15, 2010 10:07 AM  Follow-up for Phone Call        Spoke with patient and gave her ultrasound results as per Dr. Juanda Chance. Follow-up by: Jesse Fall RN,  July 15, 2010 10:55 AM

## 2010-12-07 ENCOUNTER — Inpatient Hospital Stay (INDEPENDENT_AMBULATORY_CARE_PROVIDER_SITE_OTHER)
Admission: RE | Admit: 2010-12-07 | Discharge: 2010-12-07 | Disposition: A | Discharge status: Home / Self Care | Payer: 59 | Source: Ambulatory Visit | Attending: Family Medicine | Admitting: Family Medicine

## 2010-12-07 DIAGNOSIS — R071 Chest pain on breathing: Secondary | ICD-10-CM

## 2010-12-07 DIAGNOSIS — M549 Dorsalgia, unspecified: Secondary | ICD-10-CM

## 2010-12-07 LAB — BILIRUBIN, TOTAL: Total Bilirubin: 0.4 mg/dL (ref 0.3–1.2)

## 2010-12-07 LAB — AST: AST: 18 U/L (ref 0–37)

## 2010-12-07 LAB — GAMMA GT: GGT: 13 U/L (ref 7–51)

## 2010-12-07 LAB — ALT: ALT: 13 U/L (ref 0–35)

## 2011-08-06 ENCOUNTER — Ambulatory Visit (INDEPENDENT_AMBULATORY_CARE_PROVIDER_SITE_OTHER): Payer: 59 | Admitting: Internal Medicine

## 2011-08-06 ENCOUNTER — Encounter: Payer: Self-pay | Admitting: Internal Medicine

## 2011-08-06 ENCOUNTER — Telehealth: Payer: Self-pay | Admitting: *Deleted

## 2011-08-06 DIAGNOSIS — M791 Myalgia, unspecified site: Secondary | ICD-10-CM

## 2011-08-06 DIAGNOSIS — H9209 Otalgia, unspecified ear: Secondary | ICD-10-CM

## 2011-08-06 DIAGNOSIS — IMO0001 Reserved for inherently not codable concepts without codable children: Secondary | ICD-10-CM

## 2011-08-06 DIAGNOSIS — E785 Hyperlipidemia, unspecified: Secondary | ICD-10-CM

## 2011-08-06 MED ORDER — FLUTICASONE PROPIONATE 50 MCG/ACT NA SUSP: 2.0000 | Freq: Every day | NASAL | Status: DC

## 2011-08-06 NOTE — Telephone Encounter (Signed)
Opened in error

## 2011-08-06 NOTE — Progress Notes (Signed)
  Subjective:    Patient ID: Brooke Bright, female    DOB: [birth year], 56 y.o.   MRN: [id number]  HPI Pt presents to clinic for evaluation of ear pain. Notes chronic intermittent ear fullness with mild radiating pain to both sides of neck diagonally. No ear drainage, fever or chills. Has chronic msk pain ?myofascial involving neck, trapezius and right lbp without radicular sx's. Massage definitely helps. Takes no regular medication for the problem. Past +ANA evaluated by rheumatology. Reviewed significant hyperlipidemia last evaluated 2011 with ldl 191 and tchol 287. Not interested in medication for chol. Total time of visit 30 mins of which >50% spent in counseling  No past medical history on file. No past surgical history on file.  does not have a smoking history on file. She does not have any smokeless tobacco history on file. Her alcohol and drug histories not on file. family history is not on file. Allergies  Allergen Reactions  . Sulfonamide Derivatives      Review of Systems see hpi    Objective:   Physical Exam  Nursing note and vitals reviewed. Constitutional: She appears well-developed and well-nourished. No distress.  HENT:  Head: Normocephalic and atraumatic.  Right Ear: Tympanic membrane, external ear and ear canal normal.  Left Ear: Tympanic membrane, external ear and ear canal normal.  Nose: Nose normal.  Mouth/Throat: Oropharynx is clear and moist. No oropharyngeal exudate.  Eyes: Conjunctivae are normal. Right eye exhibits no discharge. Left eye exhibits no discharge. No scleral icterus.  Neck: Neck supple. No thyromegaly present.  Lymphadenopathy:    She has no cervical adenopathy.  Neurological: She is alert.  Skin: Skin is warm and dry. She is not diaphoretic.  Psychiatric: She has a normal mood and affect.          Assessment & Plan:

## 2011-08-08 DIAGNOSIS — H9209 Otalgia, unspecified ear: Secondary | ICD-10-CM | POA: Insufficient documentation

## 2011-08-08 DIAGNOSIS — M791 Myalgia, unspecified site: Secondary | ICD-10-CM | POA: Insufficient documentation

## 2011-08-08 NOTE — Assessment & Plan Note (Signed)
Suspect eustachian tube etiology. Attempt short course of decongestant. Begin flonase. Followup if no improvement or worsening.

## 2011-08-08 NOTE — Assessment & Plan Note (Signed)
S/p rheumatology evaluation. Recommend regular exercise. Prescription written for therapeutic massage

## 2011-08-08 NOTE — Assessment & Plan Note (Signed)
Discussed potential contribution to risk of cad untreated. Not currently interested in medication. Asked to consider rechecking lipids in future

## 2012-04-24 ENCOUNTER — Other Ambulatory Visit (HOSPITAL_BASED_OUTPATIENT_CLINIC_OR_DEPARTMENT_OTHER): Payer: Self-pay | Admitting: Rheumatology

## 2012-04-24 DIAGNOSIS — M5416 Radiculopathy, lumbar region: Secondary | ICD-10-CM

## 2012-04-25 ENCOUNTER — Ambulatory Visit (HOSPITAL_BASED_OUTPATIENT_CLINIC_OR_DEPARTMENT_OTHER)
Admission: RE | Admit: 2012-04-25 | Discharge: 2012-04-25 | Disposition: A | Discharge status: Home / Self Care | Payer: 59 | Source: Ambulatory Visit | Attending: Rheumatology | Admitting: Rheumatology

## 2012-04-25 DIAGNOSIS — M5416 Radiculopathy, lumbar region: Secondary | ICD-10-CM

## 2012-04-25 DIAGNOSIS — M51379 Other intervertebral disc degeneration, lumbosacral region without mention of lumbar back pain or lower extremity pain: Secondary | ICD-10-CM | POA: Insufficient documentation

## 2012-04-25 DIAGNOSIS — M5137 Other intervertebral disc degeneration, lumbosacral region: Secondary | ICD-10-CM | POA: Insufficient documentation

## 2012-10-03 ENCOUNTER — Other Ambulatory Visit: Payer: Self-pay | Admitting: Internal Medicine

## 2012-10-04 NOTE — Telephone Encounter (Signed)
Please advise refill? Fluticasone 50 mcg/act. Patients last appointment was on 08/06/11.

## 2013-04-18 ENCOUNTER — Ambulatory Visit: Payer: 59 | Attending: Sports Medicine | Admitting: Rehabilitation

## 2013-04-18 DIAGNOSIS — M25519 Pain in unspecified shoulder: Secondary | ICD-10-CM | POA: Insufficient documentation

## 2013-04-18 DIAGNOSIS — IMO0001 Reserved for inherently not codable concepts without codable children: Secondary | ICD-10-CM | POA: Insufficient documentation

## 2013-04-18 DIAGNOSIS — M542 Cervicalgia: Secondary | ICD-10-CM | POA: Insufficient documentation

## 2013-04-23 ENCOUNTER — Ambulatory Visit: Payer: 59 | Admitting: Rehabilitation

## 2013-04-25 ENCOUNTER — Ambulatory Visit: Payer: 59 | Admitting: Rehabilitation

## 2013-04-30 ENCOUNTER — Ambulatory Visit: Payer: 59 | Admitting: Rehabilitation

## 2013-05-02 ENCOUNTER — Ambulatory Visit: Payer: 59 | Admitting: Rehabilitation

## 2013-05-16 ENCOUNTER — Ambulatory Visit: Payer: 59 | Attending: Sports Medicine | Admitting: Rehabilitation

## 2013-05-16 DIAGNOSIS — M542 Cervicalgia: Secondary | ICD-10-CM | POA: Insufficient documentation

## 2013-05-16 DIAGNOSIS — IMO0001 Reserved for inherently not codable concepts without codable children: Secondary | ICD-10-CM | POA: Insufficient documentation

## 2013-05-16 DIAGNOSIS — M25519 Pain in unspecified shoulder: Secondary | ICD-10-CM | POA: Insufficient documentation

## 2014-02-05 ENCOUNTER — Encounter (HOSPITAL_BASED_OUTPATIENT_CLINIC_OR_DEPARTMENT_OTHER): Payer: Self-pay | Admitting: Emergency Medicine

## 2014-02-05 ENCOUNTER — Emergency Department (HOSPITAL_BASED_OUTPATIENT_CLINIC_OR_DEPARTMENT_OTHER): Payer: 59

## 2014-02-05 ENCOUNTER — Emergency Department (HOSPITAL_BASED_OUTPATIENT_CLINIC_OR_DEPARTMENT_OTHER)
Admission: EM | Admit: 2014-02-05 | Discharge: 2014-02-05 | Disposition: A | Discharge status: Home / Self Care | Payer: 59 | Attending: Emergency Medicine | Admitting: Emergency Medicine

## 2014-02-05 DIAGNOSIS — IMO0002 Reserved for concepts with insufficient information to code with codable children: Secondary | ICD-10-CM | POA: Diagnosis not present

## 2014-02-05 DIAGNOSIS — Y93B9 Activity, other involving muscle strengthening exercises: Secondary | ICD-10-CM | POA: Insufficient documentation

## 2014-02-05 DIAGNOSIS — Z8739 Personal history of other diseases of the musculoskeletal system and connective tissue: Secondary | ICD-10-CM | POA: Diagnosis not present

## 2014-02-05 DIAGNOSIS — M545 Low back pain, unspecified: Secondary | ICD-10-CM | POA: Diagnosis present

## 2014-02-05 DIAGNOSIS — X503XXA Overexertion from repetitive movements, initial encounter: Secondary | ICD-10-CM | POA: Insufficient documentation

## 2014-02-05 DIAGNOSIS — X500XXA Overexertion from strenuous movement or load, initial encounter: Secondary | ICD-10-CM | POA: Diagnosis not present

## 2014-02-05 DIAGNOSIS — Z862 Personal history of diseases of the blood and blood-forming organs and certain disorders involving the immune mechanism: Secondary | ICD-10-CM | POA: Insufficient documentation

## 2014-02-05 DIAGNOSIS — S335XXA Sprain of ligaments of lumbar spine, initial encounter: Secondary | ICD-10-CM | POA: Insufficient documentation

## 2014-02-05 DIAGNOSIS — Z79899 Other long term (current) drug therapy: Secondary | ICD-10-CM | POA: Insufficient documentation

## 2014-02-05 DIAGNOSIS — S39012A Strain of muscle, fascia and tendon of lower back, initial encounter: Secondary | ICD-10-CM

## 2014-02-05 DIAGNOSIS — Y929 Unspecified place or not applicable: Secondary | ICD-10-CM | POA: Insufficient documentation

## 2014-02-05 DIAGNOSIS — Z8639 Personal history of other endocrine, nutritional and metabolic disease: Secondary | ICD-10-CM | POA: Insufficient documentation

## 2014-02-05 HISTORY — DX: Unspecified osteoarthritis, unspecified site: M19.90

## 2014-02-05 MED ORDER — MORPHINE SULFATE 4 MG/ML IJ SOLN
4.0000 mg | Freq: Once | INTRAMUSCULAR | Status: AC
  Administered 2014-02-05: 4 mg via INTRAVENOUS
  Filled 2014-02-05: qty 1

## 2014-02-05 MED ORDER — DIAZEPAM 5 MG/ML IJ SOLN
5.0000 mg | Freq: Once | INTRAMUSCULAR | Status: AC
  Administered 2014-02-05: 5 mg via INTRAVENOUS
  Filled 2014-02-05: qty 2

## 2014-02-05 MED ORDER — OXYCODONE-ACETAMINOPHEN 5-325 MG PO TABS: 1.0000 | ORAL_TABLET | Freq: Four times a day (QID) | ORAL | Status: DC | PRN

## 2014-02-05 MED ORDER — CYCLOBENZAPRINE HCL 10 MG PO TABS: 10.0000 mg | ORAL_TABLET | Freq: Two times a day (BID) | ORAL | Status: DC | PRN

## 2014-02-05 MED ORDER — KETOROLAC TROMETHAMINE 30 MG/ML IJ SOLN
30.0000 mg | Freq: Once | INTRAMUSCULAR | Status: AC
  Administered 2014-02-05: 30 mg via INTRAVENOUS
  Filled 2014-02-05: qty 1

## 2014-02-05 NOTE — ED Provider Notes (Signed)
CSN: [id number]     Arrival date & time 02/05/14  1848 History  This chart was scribed for Brooke Canal, MD by Roxy Cedar, ED Scribe. This patient was seen in room MH06/MH06 and the patient's care was started at 7:11 PM.  Chief Complaint  Patient presents with  . Back Pain   The history is provided by the patient. No language interpreter was used.   HPI Comments: Brooke Bright is a 58 y.o. female with a history of osteoarthritis, who presents to the Emergency Department complaining of moderate right sided lower back pain that began earlier today after working out using an exercise ball. She denies associated head injury or numbness in legs bilaterally. She denies injury from a fall. She states that she strained her back during exercise, and that the pain radiates down to sacral area and right leg with movement. Patient denies trouble urinating. Denies numbness.   Past Medical History  Diagnosis Date  . Headache(784.0)   . Normal MRI 04/2008    normal MRI of Brain  . Osteopenia   . Hyperlipidemia   . Vitamin D deficiency     low vitamin D  . Arthritis    Past Surgical History  Procedure Laterality Date  . Knee arthroscopy  1994    left knee  . Tonsillectomy  1992   Family History  Problem Relation Age of Onset  . Hypertension Mother   . Fibromyalgia Mother   . Osteoarthritis Mother   . Graves' disease Paternal Aunt   . Immunodeficiency      auto immune disease-father side of family  . Healthy Brother   . Heart disease Maternal Grandmother    History  Substance Use Topics  . Smoking status: Never Smoker   . Smokeless tobacco: Never Used  . Alcohol Use: Yes   OB History   Grav Para Term Preterm Abortions TAB SAB Ect Mult Living                 Review of Systems  Cardiovascular: Negative for leg swelling.  Genitourinary: Negative for dysuria.  Musculoskeletal: Positive for back pain. Negative for neck pain.  Skin: Negative for wound.  Neurological: Negative  for syncope, light-headedness and numbness.  All other systems reviewed and are negative.  Allergies  Sulfonamide derivatives  Home Medications   Prior to Admission medications   Medication Sig Start Date End Date Taking? Authorizing Provider  cyclobenzaprine (FLEXERIL) 10 MG tablet Take 10 mg by mouth 3 (three) times daily as needed for muscle spasms.   Yes Historical Provider, MD  meloxicam (MOBIC) 15 MG tablet Take 15 mg by mouth once as needed for pain.   Yes Historical Provider, MD  estradiol (ESTRACE) 0.5 MG tablet Take 0.5 mg by mouth daily.    Historical Provider, MD  fluticasone (FLONASE) 50 MCG/ACT nasal spray PLACE 2 SPRAYS INTO THE NOSE AT BEDTIME. 10/03/12   Bradd Canary, MD  progesterone (PROMETRIUM) 100 MG capsule Take 100 mg by mouth daily.    Historical Provider, MD   Triage Vitals: BP 132/84  Pulse 65  Temp(Src) 97.7 F (36.5 C) (Oral)  Resp 18  SpO2 99%  Physical Exam  Nursing note and vitals reviewed. Constitutional: She is oriented to person, place, and time. She appears well-developed and well-nourished. No distress.  HENT:  Head: Normocephalic and atraumatic.  Eyes: Conjunctivae and EOM are normal.  Neck: Neck supple. No tracheal deviation present.  Cardiovascular: Normal rate, regular rhythm and normal heart  sounds.   Pulmonary/Chest: Effort normal and breath sounds normal. No respiratory distress.  Abdominal: Soft. Bowel sounds are normal.  Musculoskeletal: Normal range of motion. She exhibits tenderness.  No midline tenderness. Right para lumbar tenderness.   Neurological: She is alert and oriented to person, place, and time.  Postitive straight leg test on the right. No saddle anesthesia. Nl reflexes. Neurovascular intact lower extremities   Skin: Skin is warm and dry.  Psychiatric: She has a normal mood and affect. Her behavior is normal.    ED Course  Procedures (including critical care time)  DIAGNOSTIC STUDIES: Oxygen Saturation is 99% on  RA, normal by my interpretation.    COORDINATION OF CARE: 7:19 PM- Discussed plans to administer pain medications via IV. Pt advised of plan for treatment and pt agrees.  Labs Review Labs Reviewed - No data to display  Imaging Review Ct Lumbar Spine Wo Contrast  02/05/2014   CLINICAL DATA:  Back injury R leg parethesia. R/o disc protrusion Back injury R leg parethesia. R/o disc protrusion  EXAM: CT LUMBAR SPINE WITHOUT CONTRAST  TECHNIQUE: Multidetector CT imaging of the lumbar spine was performed without intravenous contrast administration. Multiplanar CT image reconstructions were also generated.  COMPARISON:  MR 04/25/2012  FINDINGS: Numbering scheme follows that used on prior study. Normal alignment. Negative for fracture. Mild circumferential disc bulge L2-3. Small Schmorl's node in the superior endplate of L3 as before. Vertebral body and disc height maintained throughout. Mild facet degenerative hypertrophy bilaterally L5-S1. No significant foraminal osseous stenosis. Regional soft tissues unremarkable.  IMPRESSION: 1. Negative for fracture or other acute abnormality. 2. Mild degenerative changes as above, largely stable since previous exam.   Electronically Signed   By: Oley Balm M.D.   On: 02/05/2014 21:16     EKG Interpretation None      MDM   Final diagnoses:  None   Brooke Bright is a 58 y.o. female here with back pain. Neurovascular intact. After morphine, toradol, valium, still had some pain and trouble walking. CT showed no protruded disc and she subsequently was able to bear weight and ambulate with steady gait. I doubt spinal injury. Will d/c with motrin, percocet, prn flexeril.    I personally performed the services described in this documentation, which was scribed in my presence. The recorded information has been reviewed and is accurate.   Brooke Canal, MD 02/05/14 2129

## 2014-02-05 NOTE — ED Notes (Signed)
Working out at gym onset of back pain

## 2014-02-05 NOTE — Discharge Instructions (Signed)
Take motrin 600 mg every 6 hrs for pain.   Take percocet for severe pain. DO NOT drive with it.   Take flexeril for muscle spasms.   Follow up with orthopedic doctor. You may need physical therapy or injections if pain not improved with pain meds.   Avoid strenuous exercise.   Return to ER if you have leg weakness, numbness, trouble urinating.

## 2014-02-05 NOTE — ED Notes (Signed)
EKG requested Per RN Jasmine December due to Heart flutter

## 2014-02-05 NOTE — ED Notes (Signed)
Patient transported to CT 

## 2014-02-05 NOTE — ED Notes (Signed)
pts BP noted. States she has been having intermittent fluttering in her chest. EKG ordered.

## 2014-02-07 ENCOUNTER — Ambulatory Visit (INDEPENDENT_AMBULATORY_CARE_PROVIDER_SITE_OTHER): Payer: 59 | Admitting: Family Medicine

## 2014-02-07 ENCOUNTER — Encounter: Payer: Self-pay | Admitting: Family Medicine

## 2014-02-07 VITALS — BP 121/80 | HR 69 | Ht 65.0 in | Wt 138.0 lb

## 2014-02-07 DIAGNOSIS — M5430 Sciatica, unspecified side: Secondary | ICD-10-CM

## 2014-02-07 DIAGNOSIS — M5441 Lumbago with sciatica, right side: Secondary | ICD-10-CM

## 2014-02-07 MED ORDER — PREDNISONE (PAK) 10 MG PO TABS: ORAL_TABLET | ORAL | Status: DC

## 2014-02-07 MED ORDER — OXYCODONE-ACETAMINOPHEN 5-325 MG PO TABS: 1.0000 | ORAL_TABLET | Freq: Four times a day (QID) | ORAL | Status: DC | PRN

## 2014-02-07 MED ORDER — CYCLOBENZAPRINE HCL 10 MG PO TABS: 10.0000 mg | ORAL_TABLET | Freq: Three times a day (TID) | ORAL | Status: DC | PRN

## 2014-02-07 NOTE — Patient Instructions (Signed)
Your pain is due to severe lumbar strain or radiculopathy from disc herniation. These are treated similarly initially. A prednisone dose pack is the best option for immediate relief and may be prescribed. Day after finishing prednisone start aleve 2 tabs twice a day with food for pain and inflammation. Percocet as needed for severe pain (no driving on this medicine). Flexeril as needed for muscle spasms (no driving on this medicine if it makes you sleepy). Stay as active as possible. Physical therapy has been shown to be helpful as well - will consider if you're improving. Strengthening of low back muscles, abdominal musculature are key for long term pain relief. If not improving, will consider further imaging (MRI). Call me in 1-2 weeks to let me know how you're doing. Out of work through Tuesday.

## 2014-02-08 ENCOUNTER — Encounter: Payer: Self-pay | Admitting: Family Medicine

## 2014-02-08 DIAGNOSIS — M5441 Lumbago with sciatica, right side: Secondary | ICD-10-CM | POA: Insufficient documentation

## 2014-02-08 NOTE — Progress Notes (Addendum)
Patient ID: Brooke Bright, female   DOB: [birth year], 58 y.o.   MRN: [id number]  PCP: Letitia Libra, Ala Dach, MD  Subjective:   HPI: Patient is a 58 y.o. female here for low back pain.  Patient reports she was working out at [employer] on 9/1. Was lying on her back on an exercise ball, contracting abs. Tried to get up from this position and couldn't do so. Pain has intensified since that time - currently with 8/10 pain. Difficulty getting comfortable. Hard to lift right leg, this leg 'feels swollen from the inside out' No numbness or tingling. No bowel/bladder dysfunction. No prior injuries to back.  Past Medical History  Diagnosis Date  . Headache(784.0)   . Normal MRI 04/2008    normal MRI of Brain  . Osteopenia   . Hyperlipidemia   . Vitamin D deficiency     low vitamin D  . Arthritis     Current Outpatient Prescriptions on File Prior to Visit  Medication Sig Dispense Refill  . estradiol (ESTRACE) 0.5 MG tablet Take 0.5 mg by mouth daily.      . fluticasone (FLONASE) 50 MCG/ACT nasal spray PLACE 2 SPRAYS INTO THE NOSE AT BEDTIME.  16 g  6  . meloxicam (MOBIC) 15 MG tablet Take 15 mg by mouth once as needed for pain.      . progesterone (PROMETRIUM) 100 MG capsule Take 100 mg by mouth daily.       No current facility-administered medications on file prior to visit.    Past Surgical History  Procedure Laterality Date  . Knee arthroscopy  1994    left knee  . Tonsillectomy  1992    Allergies  Allergen Reactions  . Sulfonamide Derivatives     History   Social History  . Marital Status: Married    Spouse Name: N/A    Number of Children: N/A  . Years of Education: N/A   Occupational History  . Not on file.   Social History Main Topics  . Smoking status: Never Smoker   . Smokeless tobacco: Never Used  . Alcohol Use: Yes  . Drug Use: No  . Sexual Activity: Not on file   Other Topics Concern  . 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 Hypertension Mother   . Fibromyalgia Mother   . Osteoarthritis Mother   . Graves' disease Paternal Aunt   . Immunodeficiency      auto immune disease-father side of family  . Healthy Brother   . Heart disease Maternal Grandmother     BP 121/80  Pulse 69  Ht  (1.651 m)  Wt 138 lb (62.596 kg)  BMI 22.96 kg/m2  Review of Systems: See HPI above.    Objective:  Physical Exam:  Gen: NAD  Back: No gross deformity, scoliosis. No focal TTP.  No midline or bony TTP. Unable to extend past 5 degrees, flex past 30 degrees due to pain. Strength LEs 5/5 all muscle groups except 4/5 with right hip flexion, painful in back to do this.   2+ MSRs in patellar and achilles tendons, equal bilaterally.  Negative SLRs. Sensation intact to light touch bilaterally. Negative logroll bilateral hips    Assessment & Plan:  1. Low back pain - no tenderness as would suspect with severe lumbar strain.  Had a CT in the ED and while this is not as sensitive for disc herniation, none was seen.  Both treated similarly initially.  Start with prednisone dose pack, percocet and flexeril as needed.  Call in 1-2 weeks to let me know how she's doing - add PT if improving.  MRI if not improving.  Out of work through Tuesday.  Addendum:  MRI reviewed and discussed with patient.  No change from MRI 2 years ago.  No sign of a radiculopathic source.  She is improving and did a little work last week.  Will start physical therapy at this point.  Follow up in 4-6 weeks or as needed.

## 2014-02-08 NOTE — Assessment & Plan Note (Signed)
no tenderness as would suspect with severe lumbar strain.  Had a CT in the ED and while this is not as sensitive for disc herniation, none was seen.  Both treated similarly initially.  Start with prednisone dose pack, percocet and flexeril as needed.  Call in 1-2 weeks to let me know how she's doing - add PT if improving.  MRI if not improving.  Out of work through Tuesday.

## 2014-02-13 ENCOUNTER — Telehealth: Payer: Self-pay | Admitting: Family Medicine

## 2014-02-13 NOTE — Telephone Encounter (Signed)
Usually prednisone gives people more energy, not less - I think it's unlikely to cause fatigue and a sore throat though possible.  We have the FMLA paperwork.  She was to call us in a week (which is now) to let us know how she's doing - if improving I would add physical therapy.  If not improving, we discussed an MRI.

## 2014-02-13 NOTE — Addendum Note (Signed)
Addended by: Kathi Simpers F on: 02/13/2014 02:51 PM   Modules accepted: Orders

## 2014-02-13 NOTE — Telephone Encounter (Signed)
Ok to go ahead with lumbar spine MRI then.  Thanks!

## 2014-02-13 NOTE — Telephone Encounter (Signed)
Spoke with patient and she stated that her lower back hurts and is going down into the right side. She would like to go ahead and do a MRI.

## 2014-02-15 ENCOUNTER — Encounter: Payer: Self-pay | Admitting: Physician Assistant

## 2014-02-15 ENCOUNTER — Ambulatory Visit (INDEPENDENT_AMBULATORY_CARE_PROVIDER_SITE_OTHER): Payer: 59 | Admitting: Physician Assistant

## 2014-02-15 VITALS — BP 125/78 | HR 77 | Temp 98.0°F | Wt 138.0 lb

## 2014-02-15 DIAGNOSIS — J208 Acute bronchitis due to other specified organisms: Secondary | ICD-10-CM

## 2014-02-15 DIAGNOSIS — J209 Acute bronchitis, unspecified: Secondary | ICD-10-CM

## 2014-02-15 MED ORDER — AZITHROMYCIN 250 MG PO TABS: ORAL_TABLET | ORAL | Status: DC

## 2014-02-15 MED ORDER — HYDROCOD POLST-CHLORPHEN POLST 10-8 MG/5ML PO LQCR: 5.0000 mL | Freq: Two times a day (BID) | ORAL | Status: DC | PRN

## 2014-02-15 NOTE — Patient Instructions (Signed)
Please take antibiotic as directed.  Increase your fluid intake.  Rest.  Saline nasal spray.  Mucinex for congestion.  Use the Tussionex for cough.  Place a humidifier in the bedroom.  Call or return to clinic if symptoms are not improving.  Acute Bronchitis Bronchitis is when the airways that extend from the windpipe into the lungs get red, puffy, and painful (inflamed). Bronchitis often causes thick spit (mucus) to develop. This leads to a cough. A cough is the most common symptom of bronchitis. In acute bronchitis, the condition usually begins suddenly and goes away over time (usually in 2 weeks). Smoking, allergies, and asthma can make bronchitis worse. Repeated episodes of bronchitis may cause more lung problems. HOME CARE  Rest.  Drink enough fluids to keep your pee (urine) clear or pale yellow (unless you need to limit fluids as told by your doctor).  Only take over-the-counter or prescription medicines as told by your doctor.  Avoid smoking and secondhand smoke. These can make bronchitis worse. If you are a smoker, think about using nicotine gum or skin patches. Quitting smoking will help your lungs heal faster.  Reduce the chance of getting bronchitis again by:  Washing your hands often.  Avoiding people with cold symptoms.  Trying not to touch your hands to your mouth, nose, or eyes.  Follow up with your doctor as told. GET HELP IF: Your symptoms do not improve after 1 week of treatment. Symptoms include:  Cough.  Fever.  Coughing up thick spit.  Body aches.  Chest congestion.  Chills.  Shortness of breath.  Sore throat. GET HELP RIGHT AWAY IF:   You have an increased fever.  You have chills.  You have severe shortness of breath.  You have bloody thick spit (sputum).  You throw up (vomit) often.  You lose too much body fluid (dehydration).  You have a severe headache.  You faint. MAKE SURE YOU:   Understand these instructions.  Will watch your  condition.  Will get help right away if you are not doing well or get worse. Document Released: 11/10/2007 Document Revised: 01/24/2013 Document Reviewed: 11/14/2012 [facility] Patient Information 2015 [place], [place]. This information is not intended to replace advice given to you by your health care provider. Make sure you discuss any questions you have with your health care provider.

## 2014-02-15 NOTE — Progress Notes (Signed)
Pre visit review using our clinic review tool, if applicable. No additional management support is needed unless otherwise documented below in the visit note. 

## 2014-02-16 ENCOUNTER — Ambulatory Visit (HOSPITAL_BASED_OUTPATIENT_CLINIC_OR_DEPARTMENT_OTHER): Payer: 59

## 2014-02-16 ENCOUNTER — Ambulatory Visit (HOSPITAL_BASED_OUTPATIENT_CLINIC_OR_DEPARTMENT_OTHER)
Admission: RE | Admit: 2014-02-16 | Discharge: 2014-02-16 | Disposition: A | Discharge status: Home / Self Care | Payer: 59 | Source: Ambulatory Visit | Attending: Family Medicine | Admitting: Family Medicine

## 2014-02-16 DIAGNOSIS — M545 Low back pain, unspecified: Secondary | ICD-10-CM | POA: Insufficient documentation

## 2014-02-16 DIAGNOSIS — M47817 Spondylosis without myelopathy or radiculopathy, lumbosacral region: Secondary | ICD-10-CM | POA: Insufficient documentation

## 2014-02-16 DIAGNOSIS — M5441 Lumbago with sciatica, right side: Secondary | ICD-10-CM

## 2014-02-17 DIAGNOSIS — J208 Acute bronchitis due to other specified organisms: Secondary | ICD-10-CM | POA: Insufficient documentation

## 2014-02-17 NOTE — Progress Notes (Signed)
Patient presents to clinic today c/o ST, ear pain, tooth pain, sinus pressure, sinus pain and cough that is now productive of yellow-green sputum.  Patient denies fever, chills, aches.  Denies recent travel or sick contact.  Past Medical History  Diagnosis Date  . Headache(784.0)   . Normal MRI 04/2008    normal MRI of Brain  . Osteopenia   . Hyperlipidemia   . Vitamin D deficiency     low vitamin D  . Arthritis     Current Outpatient Prescriptions on File Prior to Visit  Medication Sig Dispense Refill  . cyclobenzaprine (FLEXERIL) 10 MG tablet Take 1 tablet (10 mg total) by mouth 3 (three) times daily as needed for muscle spasms.  60 tablet  1  . fluticasone (FLONASE) 50 MCG/ACT nasal spray PLACE 2 SPRAYS INTO THE NOSE AT BEDTIME.  16 g  6  . meloxicam (MOBIC) 15 MG tablet Take 15 mg by mouth once as needed for pain.      Marland Kitchen oxyCODONE-acetaminophen (PERCOCET/ROXICET) 5-325 MG per tablet Take 1 tablet by mouth every 6 (six) hours as needed for moderate pain or severe pain.  40 tablet  0  . estradiol (ESTRACE) 0.5 MG tablet Take 0.5 mg by mouth daily.      . predniSONE (STERAPRED UNI-PAK) 10 MG tablet 6 tabs po day 1, 5 tabs po day 2, 4 tabs po day 3, 3 tabs po day 4, 2 tabs po day 5, 1 tab po day 6  21 tablet  0  . progesterone (PROMETRIUM) 100 MG capsule Take 100 mg by mouth daily.       No current facility-administered medications on file prior to visit.    Allergies  Allergen Reactions  . Sulfonamide Derivatives     Family History  Problem Relation Age of Onset  . Hypertension Mother   . Fibromyalgia Mother   . Osteoarthritis Mother   . Graves' disease Paternal Aunt   . Immunodeficiency      auto immune disease-father side of family  . Healthy Brother   . Heart disease Maternal Grandmother     History   Social History  . Marital Status: Married    Spouse Name: N/A    Number of Children: N/A  . Years of Education: N/A   Social History Main Topics  . Smoking  status: Never Smoker   . Smokeless tobacco: Never Used  . Alcohol Use: Yes  . Drug Use: No  . Sexual Activity: None   Other Topics Concern  . 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  All other ROS are negative.  BP 125/78  Pulse 77  Temp(Src) 98 F (36.7 C)  Wt 138 lb (62.596 kg)  SpO2 100%  Physical Exam  Vitals reviewed. Constitutional: She is oriented to person, place, and time and well-developed, well-nourished, and in no distress.  HENT:  Head: Normocephalic and atraumatic.  Right Ear: External ear normal.  Left Ear: External ear normal.  Nose: Nose normal.  Mouth/Throat: Oropharynx is clear and moist. No oropharyngeal exudate.  TM within normal limits bilaterally.  Eyes: Conjunctivae are normal.  Neck: Neck supple.  Cardiovascular: Normal rate, regular  rhythm, normal heart sounds and intact distal pulses.   Pulmonary/Chest: Effort normal and breath sounds normal. No respiratory distress. She has no wheezes. She has no rales. She exhibits no tenderness.  Lymphadenopathy:    She has no cervical adenopathy.  Neurological: She is alert and oriented to person, place, and time.  Skin: Skin is warm and dry. No rash noted.  Psychiatric: Affect normal.    No results found for this or any previous visit (from the past 2160 hour(s)).  Assessment/Plan: Acute bronchitis due to other specified organisms Rx Azithromycin.  Increase fluids.  Rest.  Saline nasal spray. Mucinex.  Humidifier in bedroom. Rx Tussionex for cough. Return precautions discussed with patient.

## 2014-02-17 NOTE — Assessment & Plan Note (Addendum)
Rx Azithromycin.  Increase fluids.  Rest.  Saline nasal spray. Mucinex.  Humidifier in bedroom. Rx Tussionex for cough. Return precautions discussed with patient.

## 2014-02-18 NOTE — Addendum Note (Signed)
Addended by: Kathi Simpers F on: 02/18/2014 03:25 PM   Modules accepted: Orders

## 2014-02-25 ENCOUNTER — Ambulatory Visit: Payer: 59 | Attending: Family Medicine | Admitting: Physical Therapy

## 2014-02-25 DIAGNOSIS — M545 Low back pain, unspecified: Secondary | ICD-10-CM | POA: Insufficient documentation

## 2014-02-25 DIAGNOSIS — R293 Abnormal posture: Secondary | ICD-10-CM | POA: Diagnosis not present

## 2014-02-25 DIAGNOSIS — IMO0001 Reserved for inherently not codable concepts without codable children: Secondary | ICD-10-CM | POA: Diagnosis present

## 2014-02-28 ENCOUNTER — Ambulatory Visit: Payer: 59 | Admitting: Physical Therapy

## 2014-03-05 ENCOUNTER — Ambulatory Visit: Payer: 59 | Admitting: Physical Therapy

## 2014-03-12 ENCOUNTER — Ambulatory Visit: Payer: 59 | Attending: Family Medicine | Admitting: Rehabilitation

## 2014-03-12 DIAGNOSIS — R293 Abnormal posture: Secondary | ICD-10-CM | POA: Diagnosis not present

## 2014-03-12 DIAGNOSIS — Z5189 Encounter for other specified aftercare: Secondary | ICD-10-CM | POA: Insufficient documentation

## 2014-03-12 DIAGNOSIS — M545 Low back pain: Secondary | ICD-10-CM | POA: Diagnosis not present

## 2014-03-14 ENCOUNTER — Ambulatory Visit: Payer: 59 | Admitting: Rehabilitation

## 2014-03-14 DIAGNOSIS — Z5189 Encounter for other specified aftercare: Secondary | ICD-10-CM | POA: Diagnosis not present

## 2014-03-18 ENCOUNTER — Ambulatory Visit: Payer: 59 | Admitting: Physical Therapy

## 2014-03-21 ENCOUNTER — Ambulatory Visit: Payer: 59 | Admitting: Physical Therapy

## 2014-03-25 ENCOUNTER — Ambulatory Visit: Payer: 59 | Admitting: Rehabilitation

## 2014-03-25 DIAGNOSIS — Z5189 Encounter for other specified aftercare: Secondary | ICD-10-CM | POA: Diagnosis not present

## 2014-03-28 ENCOUNTER — Ambulatory Visit: Payer: 59 | Admitting: Physical Therapy

## 2014-04-01 ENCOUNTER — Ambulatory Visit: Payer: 59 | Admitting: Rehabilitation

## 2014-04-01 DIAGNOSIS — Z5189 Encounter for other specified aftercare: Secondary | ICD-10-CM | POA: Diagnosis not present

## 2014-04-04 ENCOUNTER — Ambulatory Visit: Payer: 59 | Admitting: Physical Therapy

## 2014-04-04 DIAGNOSIS — Z5189 Encounter for other specified aftercare: Secondary | ICD-10-CM | POA: Diagnosis not present

## 2014-04-11 ENCOUNTER — Ambulatory Visit: Payer: 59 | Admitting: Physical Therapy

## 2014-04-18 ENCOUNTER — Ambulatory Visit: Payer: 59 | Admitting: Rehabilitation

## 2014-04-24 ENCOUNTER — Ambulatory Visit: Payer: 59 | Admitting: Physical Therapy

## 2015-06-04 ENCOUNTER — Other Ambulatory Visit (HOSPITAL_COMMUNITY): Payer: Self-pay | Admitting: Specialist

## 2015-06-04 DIAGNOSIS — M5090 Cervical disc disorder, unspecified, unspecified cervical region: Secondary | ICD-10-CM

## 2015-06-10 DIAGNOSIS — M9905 Segmental and somatic dysfunction of pelvic region: Secondary | ICD-10-CM | POA: Diagnosis not present

## 2015-06-10 DIAGNOSIS — M9902 Segmental and somatic dysfunction of thoracic region: Secondary | ICD-10-CM | POA: Diagnosis not present

## 2015-06-10 DIAGNOSIS — M5136 Other intervertebral disc degeneration, lumbar region: Secondary | ICD-10-CM | POA: Diagnosis not present

## 2015-06-10 DIAGNOSIS — M9904 Segmental and somatic dysfunction of sacral region: Secondary | ICD-10-CM | POA: Diagnosis not present

## 2015-06-10 DIAGNOSIS — M9903 Segmental and somatic dysfunction of lumbar region: Secondary | ICD-10-CM | POA: Diagnosis not present

## 2015-06-10 DIAGNOSIS — M50122 Cervical disc disorder at C5-C6 level with radiculopathy: Secondary | ICD-10-CM | POA: Diagnosis not present

## 2015-06-10 DIAGNOSIS — M5441 Lumbago with sciatica, right side: Secondary | ICD-10-CM | POA: Diagnosis not present

## 2015-06-10 DIAGNOSIS — M9901 Segmental and somatic dysfunction of cervical region: Secondary | ICD-10-CM | POA: Diagnosis not present

## 2015-06-10 DIAGNOSIS — M5384 Other specified dorsopathies, thoracic region: Secondary | ICD-10-CM | POA: Diagnosis not present

## 2015-06-13 ENCOUNTER — Ambulatory Visit (HOSPITAL_COMMUNITY)
Admission: RE | Admit: 2015-06-13 | Discharge: 2015-06-13 | Disposition: A | Discharge status: Home / Self Care | Payer: 59 | Source: Ambulatory Visit | Attending: Specialist | Admitting: Specialist

## 2015-06-13 DIAGNOSIS — M5030 Other cervical disc degeneration, unspecified cervical region: Secondary | ICD-10-CM | POA: Diagnosis not present

## 2015-06-13 DIAGNOSIS — M5090 Cervical disc disorder, unspecified, unspecified cervical region: Secondary | ICD-10-CM | POA: Diagnosis present

## 2015-06-13 DIAGNOSIS — M2578 Osteophyte, vertebrae: Secondary | ICD-10-CM | POA: Diagnosis not present

## 2015-06-13 DIAGNOSIS — M50321 Other cervical disc degeneration at C4-C5 level: Secondary | ICD-10-CM | POA: Diagnosis not present

## 2015-06-16 DIAGNOSIS — Z1231 Encounter for screening mammogram for malignant neoplasm of breast: Secondary | ICD-10-CM | POA: Diagnosis not present

## 2015-06-16 DIAGNOSIS — Z13820 Encounter for screening for osteoporosis: Secondary | ICD-10-CM | POA: Diagnosis not present

## 2015-06-16 DIAGNOSIS — Z01419 Encounter for gynecological examination (general) (routine) without abnormal findings: Secondary | ICD-10-CM | POA: Diagnosis not present

## 2015-06-16 DIAGNOSIS — Z6824 Body mass index (BMI) 24.0-24.9, adult: Secondary | ICD-10-CM | POA: Diagnosis not present

## 2015-06-16 MED FILL — CLOTRIMAZOLE/BETAMETH CREAM: 1-0.05 | 7 days supply | Qty: 15 | Fill #0

## 2015-06-16 MED FILL — ESTRACE 0.01% CREAM: 0.1 | 90 days supply | Qty: 43 | Fill #0

## 2015-06-16 MED FILL — PROCTOSOL-HC 2.5% CREAM: 2.5 | 15 days supply | Qty: 28 | Fill #0

## 2015-06-19 DIAGNOSIS — M5032 Other cervical disc degeneration, mid-cervical region, unspecified level: Secondary | ICD-10-CM | POA: Diagnosis not present

## 2015-06-19 DIAGNOSIS — M5136 Other intervertebral disc degeneration, lumbar region: Secondary | ICD-10-CM | POA: Diagnosis not present

## 2015-06-20 DIAGNOSIS — M5090 Cervical disc disorder, unspecified, unspecified cervical region: Secondary | ICD-10-CM | POA: Diagnosis not present

## 2015-06-24 DIAGNOSIS — M9902 Segmental and somatic dysfunction of thoracic region: Secondary | ICD-10-CM | POA: Diagnosis not present

## 2015-06-24 DIAGNOSIS — M9905 Segmental and somatic dysfunction of pelvic region: Secondary | ICD-10-CM | POA: Diagnosis not present

## 2015-06-24 DIAGNOSIS — M9901 Segmental and somatic dysfunction of cervical region: Secondary | ICD-10-CM | POA: Diagnosis not present

## 2015-06-24 DIAGNOSIS — M5384 Other specified dorsopathies, thoracic region: Secondary | ICD-10-CM | POA: Diagnosis not present

## 2015-06-24 DIAGNOSIS — M50122 Cervical disc disorder at C5-C6 level with radiculopathy: Secondary | ICD-10-CM | POA: Diagnosis not present

## 2015-06-24 DIAGNOSIS — M9904 Segmental and somatic dysfunction of sacral region: Secondary | ICD-10-CM | POA: Diagnosis not present

## 2015-06-24 DIAGNOSIS — M5441 Lumbago with sciatica, right side: Secondary | ICD-10-CM | POA: Diagnosis not present

## 2015-06-24 DIAGNOSIS — M9903 Segmental and somatic dysfunction of lumbar region: Secondary | ICD-10-CM | POA: Diagnosis not present

## 2015-06-24 DIAGNOSIS — M5136 Other intervertebral disc degeneration, lumbar region: Secondary | ICD-10-CM | POA: Diagnosis not present

## 2015-06-30 DIAGNOSIS — Z1329 Encounter for screening for other suspected endocrine disorder: Secondary | ICD-10-CM | POA: Diagnosis not present

## 2015-06-30 DIAGNOSIS — Z1321 Encounter for screening for nutritional disorder: Secondary | ICD-10-CM | POA: Diagnosis not present

## 2015-06-30 DIAGNOSIS — Z13220 Encounter for screening for lipoid disorders: Secondary | ICD-10-CM | POA: Diagnosis not present

## 2015-06-30 DIAGNOSIS — Z131 Encounter for screening for diabetes mellitus: Secondary | ICD-10-CM | POA: Diagnosis not present

## 2015-07-04 ENCOUNTER — Ambulatory Visit: Payer: 59 | Admitting: Cardiovascular Disease

## 2015-07-29 ENCOUNTER — Encounter: Payer: Self-pay | Admitting: Cardiovascular Disease

## 2015-07-29 ENCOUNTER — Ambulatory Visit (INDEPENDENT_AMBULATORY_CARE_PROVIDER_SITE_OTHER): Payer: 59 | Admitting: Cardiovascular Disease

## 2015-07-29 VITALS — BP 138/84 | HR 57 | Ht 64.0 in | Wt 143.7 lb

## 2015-07-29 DIAGNOSIS — R002 Palpitations: Secondary | ICD-10-CM | POA: Diagnosis not present

## 2015-07-29 DIAGNOSIS — R0602 Shortness of breath: Secondary | ICD-10-CM

## 2015-07-29 DIAGNOSIS — R079 Chest pain, unspecified: Secondary | ICD-10-CM

## 2015-07-29 HISTORY — DX: Palpitations: R00.2

## 2015-07-29 HISTORY — DX: Chest pain, unspecified: R07.9

## 2015-07-29 NOTE — Progress Notes (Signed)
Cardiology Office Note   Date:  07/29/2015   ID:  Brooke Bright 1956/04/12, MRN [id number]  PCP:  Letitia Libra, Ala Dach, MD  Cardiologist:   Madilyn Hook, MD   Chief Complaint  Patient presents with  . New Evaluation    Referred by Richardean Chimera, MD for chest tightness and rapid heart rate//pt states chest tightness comes and goes, random--sometimes feels like its in her throat and other times in center of chest--today it feels like it is radiating down the left shoulder blade  . Shortness of Breath    accompanied by chest tightness  . Fatigue    not sleeping very well  . Dizziness    random; pt states could be positional vertigo--does not last very long  . Edema    in bilateral legs, feet, ankles--cramping in left leg (shin)  . headaches    always on left side      History of Present Illness: Brooke Bright is a 60 y.o. female with hyperlipidemia who presents for an evaluation of multiple complaints.  Brooke Bright recently saw her OB/GYN, [name], for check up and noted occasional palpitations.  This occurs randomly both at rest and with light exercise.  It also occurs sometimes after eating.  She sometimes feels lightheaded when it happens.  The palpitations last for approximately one minute at a time  There is some mild shortness of breath but no chest pain. Several months ago the symptoms were happening daily. However, more recently it has not happened in several weeks.  Today she noted some substernal chest discomfort that feels like heaviness when she breaths.  She also sometimes notices this substernal chest discomfort when she tries to run or exert herself.  She has not been exercising regularly lately This is mostly because of orthopedic issues and fatigue.  Brooke Bright notes occasional lower extremity edema that improves by the morning. She denies any orthopnea or PND.She had her cholesterol checked by [name] and was told that  With elevated. I  recommended diet and exercise and suggested that it be repeated in one year.He checked other labs at that time but she is unaware of what was tested or the results.   Past Medical History  Diagnosis Date  . Headache(784.0)   . Normal MRI 04/2008    normal MRI of Brain  . Osteopenia   . Hyperlipidemia   . Vitamin D deficiency     low vitamin D  . Arthritis   . Palpitations 07/29/2015  . Chest pain 07/29/2015    Past Surgical History  Procedure Laterality Date  . Knee arthroscopy  1994    left knee  . Tonsillectomy  1992     Current Outpatient Prescriptions  Medication Sig Dispense Refill  . cyclobenzaprine (FLEXERIL) 10 MG tablet Take 1 tablet (10 mg total) by mouth 3 (three) times daily as needed for muscle spasms. (Patient taking differently: Take 10 mg by mouth as needed for muscle spasms. ) 60 tablet 1  . fluticasone (FLONASE) 50 MCG/ACT nasal spray PLACE 2 SPRAYS INTO THE NOSE AT BEDTIME. (Patient taking differently: PLACE 2 SPRAYS INTO THE NOSE as needed.) 16 g 6  . PROCTOSOL HC 2.5 % rectal cream Apply 1 application topically as needed.  5   No current facility-administered medications for this visit.    Allergies:   Sulfonamide derivatives    Social History:  The patient  reports that she has never smoked. She has never  used smokeless tobacco. She reports that she drinks alcohol. She reports that she does not use illicit drugs.   Family History:  The patient's family history includes Fibromyalgia in her mother; Luiz Blare' disease in her paternal aunt; Healthy in her brother; Heart disease in her maternal grandmother; Hypertension in her mother; Osteoarthritis in her mother.    ROS:  Please see the history of present illness.   Otherwise, review of systems are positive for none.   All other systems are reviewed and negative.    PHYSICAL EXAM: VS:  BP 138/84 mmHg  Pulse 57  Ht 5\' 4"  (1.626 m)  Wt 65.182 kg (143 lb 11.2 oz)  BMI 24.65 kg/m2 , BMI Body mass index is  24.65 kg/(m^2). GENERAL:  Well appearing HEENT:  Pupils equal round and reactive, fundi not visualized, oral mucosa unremarkable NECK:  No jugular venous distention, waveform within normal limits, carotid upstroke brisk and symmetric, no bruits, no thyromegaly LYMPHATICS:  No cervical adenopathy LUNGS:  Clear to auscultation bilaterally HEART:  RRR.  PMI not displaced or sustained,S1 and S2 within normal limits, no S3, no S4, no clicks, no rubs, no murmurs ABD:  Flat, positive bowel sounds normal in frequency in pitch, no bruits, no rebound, no guarding, no midline pulsatile mass, no hepatomegaly, no splenomegaly EXT:  2 plus pulses throughout, no edema, no cyanosis no clubbing SKIN:  No rashes no nodules NEURO:  Cranial nerves II through XII grossly intact, motor grossly intact throughout PSYCH:  Cognitively intact, oriented to person place and time    EKG:  EKG is ordered today. The ekg ordered today demonstrates sinus bradycardia. Rate 57 bpm.   Recent Labs: No results found for requested labs within last 365 days.    Lipid Panel    Component Value Date/Time   CHOL 287* 09/03/2008 0812   TRIG 131.0 09/03/2008 0812   HDL 64.20 09/03/2008 0812   CHOLHDL 4 09/03/2008 0812   VLDL 26.2 09/03/2008 0812   LDLDIRECT 191.8 09/03/2008 0812     Cholesterol 12/2014: HDL 80, LDL 130, chol 280, tri 130  Wt Readings from Last 3 Encounters:  07/29/15 65.182 kg (143 lb 11.2 oz)  02/15/14 62.596 kg (138 lb)  02/07/14 62.596 kg (138 lb)      ASSESSMENT AND PLAN:  # Chest pain, shortness of breath:  Brooke Bright has chest discomfort with both typical and atypical symptoms.  There is certainly an exertional component.  Therefore, we will obtain an exercise Cardiolite to evaluate for ischemia.  She has no evidence of heart failure on exam. I do not think that an echo is necessary at this time.  # Palpitations: Brooke Bright palpitations occur very sporadically and are not occurring very  frequently.  t seems unlikely that ambulatory monitoring would reveal any arrhythmias at this time.  Given the fact that they were worse when she was under a stressful situation, it seems that she likely has PACs or PVCs. We will obtain the labs that she had done with [name].  If she develops more frequent arrhythmias she will return and we will place either a Holter or event monitor at that time. For now, we will evaluate for ischemia as above.  # CV Risk assessment: Based on her blood pressure and most recent lipids, Ms. Fiske ASCVD 10 year risk is 4.3%  Therefore, we do not recommend starting aspirin or a statin.     Current medicines are reviewed at length with the patient today.  The  patient does not have concerns regarding medicines.  The following changes have been made:  no change  Labs/ tests ordered today include:   Orders Placed This Encounter  Procedures  . Myocardial Perfusion Imaging  . EKG 12-Lead     Disposition:   FU with Deontre Allsup C. Duke Salvia, MD, [facility] in 1 month.    This note was written with the assistance of speech recognition software.  Please excuse any transcriptional errors.  Signed, Dominie Benedick C. Duke Salvia, MD, [facility]  07/29/2015 5:03 PM    [facility]

## 2015-07-29 NOTE — Patient Instructions (Signed)
Your physician has requested that you have an exercise stress myoview. For further information please visit [email or web address] Please follow instruction sheet, as given.  Dr Duke Salvia recommends that you schedule a follow-up appointment in 1 month.  If you need a refill on your cardiac medications before your next appointment, please call your pharmacy.

## 2015-07-31 ENCOUNTER — Encounter: Payer: Self-pay | Admitting: Cardiovascular Disease

## 2015-08-06 ENCOUNTER — Telehealth (HOSPITAL_COMMUNITY): Payer: Self-pay

## 2015-08-06 ENCOUNTER — Telehealth: Payer: Self-pay

## 2015-08-06 NOTE — Telephone Encounter (Signed)
Encounter complete. 

## 2015-08-06 NOTE — Telephone Encounter (Signed)
Faxed signed release to [facility] - Dr Arelia Sneddon - to obtain records per Dr Leonides Sake request. Faxed to [facility] on 07/31/2015.

## 2015-08-08 ENCOUNTER — Ambulatory Visit (HOSPITAL_COMMUNITY)
Admission: RE | Admit: 2015-08-08 | Discharge: 2015-08-08 | Disposition: A | Discharge status: Home / Self Care | Payer: 59 | Source: Ambulatory Visit | Attending: Internal Medicine | Admitting: Internal Medicine

## 2015-08-08 DIAGNOSIS — R42 Dizziness and giddiness: Secondary | ICD-10-CM | POA: Insufficient documentation

## 2015-08-08 DIAGNOSIS — Z8249 Family history of ischemic heart disease and other diseases of the circulatory system: Secondary | ICD-10-CM | POA: Diagnosis not present

## 2015-08-08 DIAGNOSIS — R079 Chest pain, unspecified: Secondary | ICD-10-CM | POA: Insufficient documentation

## 2015-08-08 DIAGNOSIS — R0602 Shortness of breath: Secondary | ICD-10-CM | POA: Insufficient documentation

## 2015-08-08 DIAGNOSIS — R5383 Other fatigue: Secondary | ICD-10-CM | POA: Diagnosis not present

## 2015-08-08 DIAGNOSIS — R609 Edema, unspecified: Secondary | ICD-10-CM | POA: Insufficient documentation

## 2015-08-08 DIAGNOSIS — R002 Palpitations: Secondary | ICD-10-CM | POA: Insufficient documentation

## 2015-08-08 DIAGNOSIS — R0609 Other forms of dyspnea: Secondary | ICD-10-CM | POA: Diagnosis not present

## 2015-08-08 LAB — MYOCARDIAL PERFUSION IMAGING
CHL CUP MPHR: 158 {beats}/min
CHL CUP NUCLEAR SSS: 0
CHL RATE OF PERCEIVED EXERTION: 17
CSEPEW: 8.5 METS
CSEPHR: 96 %
CSEPPHR: 153 {beats}/min
Exercise duration (min): 7 min
Exercise duration (sec): 1 s
LVDIAVOL: 77 mL
LVSYSVOL: 37 mL
Rest HR: 55 {beats}/min
SDS: 0
SRS: 0
TID: 1.19

## 2015-08-08 MED ORDER — TECHNETIUM TC 99M SESTAMIBI GENERIC - CARDIOLITE
30.1000 | Freq: Once | INTRAVENOUS | Status: AC | PRN
  Administered 2015-08-08: 30.1 via INTRAVENOUS

## 2015-08-08 MED ORDER — TECHNETIUM TC 99M SESTAMIBI GENERIC - CARDIOLITE
11.0000 | Freq: Once | INTRAVENOUS | Status: AC | PRN
  Administered 2015-08-08: 11 via INTRAVENOUS

## 2015-08-19 ENCOUNTER — Encounter (INDEPENDENT_AMBULATORY_CARE_PROVIDER_SITE_OTHER): Payer: 59

## 2015-08-19 ENCOUNTER — Ambulatory Visit (INDEPENDENT_AMBULATORY_CARE_PROVIDER_SITE_OTHER): Payer: 59 | Admitting: Cardiovascular Disease

## 2015-08-19 ENCOUNTER — Encounter: Payer: Self-pay | Admitting: Cardiovascular Disease

## 2015-08-19 VITALS — BP 132/84 | HR 72 | Ht 64.5 in | Wt 143.9 lb

## 2015-08-19 DIAGNOSIS — R0602 Shortness of breath: Secondary | ICD-10-CM | POA: Diagnosis not present

## 2015-08-19 DIAGNOSIS — R002 Palpitations: Secondary | ICD-10-CM | POA: Diagnosis not present

## 2015-08-19 DIAGNOSIS — R0789 Other chest pain: Secondary | ICD-10-CM

## 2015-08-19 NOTE — Patient Instructions (Signed)
Medication Instructions:  Your physician recommends that you continue on your current medications as directed. Please refer to the Current Medication list given to you today.  Labwork: None   Testing/Procedures: Your physician has requested that you have an echocardiogram. Echocardiography is a painless test that uses sound waves to create images of your heart. It provides your doctor with information about the size and shape of your heart and how well your heart's chambers and valves are working. This procedure takes approximately one hour. There are no restrictions for this procedure.  Your physician has recommended that you wear an event monitor. Event monitors are medical devices that record the heart's electrical activity. Doctors most often us these monitors to diagnose arrhythmias. Arrhythmias are problems with the speed or rhythm of the heartbeat. The monitor is a small, portable device. You can wear one while you do your normal daily activities. This is usually used to diagnose what is causing palpitations/syncope (passing out). 14 day   Your physician has recommended that you have a pulmonary function test. Pulmonary Function Tests are a group of tests that measure how well air moves in and out of your lungs.  Follow-Up: Your physician recommends that you schedule a follow-up appointment in: 1 month   If you need a refill on your cardiac medications before your next appointment, please call your pharmacy.

## 2015-08-19 NOTE — Progress Notes (Signed)
Cardiology Office Note   Date:  08/19/2015   ID:  Brooke Bright, DOB September 28, 1955, MRN [id number]  PCP:  Letitia LibraHodgin Jr, Ala Dachhomas Whitson, MD  Cardiologist:   Madilyn Hookandolph, Cortni Tays P, MD   Chief Complaint  Patient presents with  . Follow-up    1 month  Stress Test  pt c/o mild chest pain, palpitations; SOB on exertion; no other Sx.      Patient ID: Brooke DineDeborah M Bright is a 60 y.o. female with hyperlipidemia who presents for an evaluation of multiple complaints.    Interval History 08/19/15: After his last appointment Brooke Bright had a stress test that was negative for ischemia.  She has been doing fairly well.  She does not exertional dyspnea.  She went hiking with her husband and had to turn around when she was unable to walk up a hill.  There was also chest pain that improved after she stopped walking.  Brooke Bright continues to have palpitations.  Over he last month it has occurred nearly 6 times.  The episodes last for a few seconds and are very disconcerting for her.  There is associated chest pain and shortness of breath.  She has not noted some mild swelling in her ankles intermittently but no orthopnea or PND.  She wonders if they could be due to anxiety.  Brooke Bright has not been exercising much lately.  In the past this was due to neck pain but lately she just hasn't felt like it.  She liked doing water aerobics before she hurt her neck.   History of Present Illness 07/29/15: Brooke Bright recently saw her OB/GYN, Dr. Arelia SneddonMcComb, for check up and noted occasional palpitations.  This occurs randomly both at rest and with light exercise.  It also occurs sometimes after eating.  She sometimes feels lightheaded when it happens.  The palpitations last for approximately one minute at a time  There is some mild shortness of breath but no chest pain. Several months ago the symptoms were happening daily. However, more recently it has not happened in several weeks.  Today she noted some substernal chest  discomfort that feels like heaviness when she breaths.  She also sometimes notices this substernal chest discomfort when she tries to run or exert herself.  She has not been exercising regularly lately This is mostly because of orthopedic issues and fatigue.  Brooke Bright notes occasional lower extremity edema that improves by the morning. She denies any orthopnea or PND.She had her cholesterol checked by Dr. Arelia SneddonMcComb and was told that  With elevated. I recommended diet and exercise and suggested that it be repeated in one year.He checked other labs at that time but she is unaware of what was tested or the results.   Past Medical History  Diagnosis Date  . Headache(784.0)   . Normal MRI 04/2008    normal MRI of Brain  . Osteopenia   . Hyperlipidemia   . Vitamin D deficiency     low vitamin D  . Arthritis   . Palpitations 07/29/2015  . Chest pain 07/29/2015  . IBS (irritable bowel syndrome)     Past Surgical History  Procedure Laterality Date  . Knee arthroscopy  1994    left knee  . Tonsillectomy  1992     Current Outpatient Prescriptions  Medication Sig Dispense Refill  . cyclobenzaprine (FLEXERIL) 10 MG tablet Take 10 mg by mouth as needed for muscle spasms.    . fluticasone (FLONASE) 50 MCG/ACT nasal spray  PLACE 2 SPRAYS INTO THE NOSE AT BEDTIME. (Patient taking differently: PLACE 2 SPRAYS INTO THE NOSE as needed.) 16 g 6  . PROCTOSOL HC 2.5 % rectal cream Apply 1 application topically as needed.  5   No current facility-administered medications for this visit.    Allergies:   Sulfonamide derivatives    Social History:  The patient  reports that she has never smoked. She has never used smokeless tobacco. She reports that she drinks alcohol. She reports that she does not use illicit drugs.   Family History:  The patient's family history includes Arthritis in her maternal grandmother; Fibromyalgia in her mother; Luiz Blare' disease in her paternal aunt; Healthy in her brother; Heart  disease in her maternal grandmother; Heart failure in her maternal grandmother; Hyperlipidemia in her mother; Hypertension in her mother; Hyperthyroidism in her maternal grandmother; Osteoarthritis in her mother.    ROS:  Please see the history of present illness.   Otherwise, review of systems are positive for neck pain.   All other systems are reviewed and negative.    PHYSICAL EXAM: VS:  BP 132/84 mmHg  Pulse 72  Ht 5' 4.5" (1.638 m)  Wt 65.273 kg (143 lb 14.4 oz)  BMI 24.33 kg/m2 , BMI Body mass index is 24.33 kg/(m^2). GENERAL:  Well appearing HEENT:  Pupils equal round and reactive, fundi not visualized, oral mucosa unremarkable NECK:  No jugular venous distention, waveform within normal limits, carotid upstroke brisk and symmetric, no bruits LYMPHATICS:  No cervical adenopathy LUNGS:  Clear to auscultation bilaterally HEART:  RRR.  PMI not displaced or sustained,S1 and S2 within normal limits, no S3, no S4, no clicks, no rubs, no murmurs ABD:  Flat, positive bowel sounds normal in frequency in pitch, no bruits, no rebound, no guarding, no midline pulsatile mass, no hepatomegaly, no splenomegaly EXT:  2 plus pulses throughout, no edema, no cyanosis no clubbing SKIN:  No rashes no nodules NEURO:  Cranial nerves II through XII grossly intact, motor grossly intact throughout PSYCH:  Cognitively intact, oriented to person place and time   EKG:  EKG is not ordered today.   Recent Labs: No results found for requested labs within last 365 days.    Lipid Panel    Component Value Date/Time   CHOL 287* 09/03/2008 0812   TRIG 131.0 09/03/2008 0812   HDL 64.20 09/03/2008 0812   CHOLHDL 4 09/03/2008 0812   VLDL 26.2 09/03/2008 0812   LDLDIRECT 191.8 09/03/2008 0812     Cholesterol 12/2014: HDL 80, LDL 130, chol 280, tri 130  Wt Readings from Last 3 Encounters:  08/19/15 65.273 kg (143 lb 14.4 oz)  08/08/15 64.864 kg (143 lb)  07/29/15 65.182 kg (143 lb 11.2 oz)       ASSESSMENT AND PLAN:  # Chest pain, shortness of breath: Stress test was normal.  She does not have evidence of heart failure on exam.  We will obtain an echo to ensure that she does not have diastolic dysfunction, as she had normal systolic function on her stress test.  We will also obtain PFTs to evaluate for a pulmonary cause of her dsypnea.  # Palpitations: The palpitations are occurring more frequently and seem to be affecting her ability to function.  Episodes are short-lived and likely represent PVCs or PACs.  We will obtain a 14 day event monitor to better determine the underlying rhythm.   Current medicines are reviewed at length with the patient today.  The patient does not  have concerns regarding medicines.  The following changes have been made:  no change  Labs/ tests ordered today include:   Orders Placed This Encounter  Procedures  . Cardiac event monitor  . ECHOCARDIOGRAM COMPLETE  . Pulmonary function test     Disposition:   FU with Brysen Shankman C. Duke Salvia, MD, [facility] in 1 month.    This note was written with the assistance of speech recognition software.  Please excuse any transcriptional errors.  Signed, Earline Stiner C. Duke Salvia, MD, [facility]  08/19/2015 5:14 PM    [facility]

## 2015-08-26 ENCOUNTER — Ambulatory Visit (HOSPITAL_COMMUNITY)
Admission: RE | Admit: 2015-08-26 | Discharge: 2015-08-26 | Disposition: A | Discharge status: Home / Self Care | Payer: 59 | Source: Ambulatory Visit | Attending: Cardiovascular Disease | Admitting: Cardiovascular Disease

## 2015-08-26 DIAGNOSIS — R0602 Shortness of breath: Secondary | ICD-10-CM | POA: Diagnosis not present

## 2015-08-26 LAB — PULMONARY FUNCTION TEST
DL/VA % pred: 109 %
DL/VA: 5.28 ml/min/mmHg/L
DLCO UNC % PRED: 93 %
DLCO unc: 22.78 ml/min/mmHg
FEF 25-75 PRE: 2.67 L/s
FEF 25-75 Post: 3.1 L/sec
FEF2575-%CHANGE-POST: 15 %
FEF2575-%PRED-POST: 137 %
FEF2575-%PRED-PRE: 118 %
FEV1-%CHANGE-POST: 4 %
FEV1-%Pred-Post: 106 %
FEV1-%Pred-Pre: 101 %
FEV1-Post: 2.67 L
FEV1-Pre: 2.55 L
FEV1FVC-%Change-Post: 4 %
FEV1FVC-%PRED-PRE: 104 %
FEV6-%Change-Post: 0 %
FEV6-%PRED-POST: 99 %
FEV6-%PRED-PRE: 99 %
FEV6-POST: 3.12 L
FEV6-PRE: 3.13 L
FEV6FVC-%PRED-PRE: 104 %
FEV6FVC-%Pred-Post: 104 %
FVC-%CHANGE-POST: 0 %
FVC-%PRED-POST: 95 %
FVC-%PRED-PRE: 96 %
FVC-PRE: 3.13 L
FVC-Post: 3.12 L
POST FEV6/FVC RATIO: 100 %
PRE FEV6/FVC RATIO: 100 %
Post FEV1/FVC ratio: 85 %
Pre FEV1/FVC ratio: 81 %
RV % PRED: 84 %
RV: 1.72 L
TLC % pred: 95 %
TLC: 4.81 L

## 2015-08-26 MED ORDER — ALBUTEROL SULFATE (2.5 MG/3ML) 0.083% IN NEBU
2.5000 mg | INHALATION_SOLUTION | Freq: Once | RESPIRATORY_TRACT | Status: AC
  Administered 2015-08-26: 2.5 mg via RESPIRATORY_TRACT

## 2015-08-27 ENCOUNTER — Telehealth: Payer: Self-pay | Admitting: Cardiovascular Disease

## 2015-08-27 DIAGNOSIS — J449 Chronic obstructive pulmonary disease, unspecified: Secondary | ICD-10-CM

## 2015-08-27 NOTE — Telephone Encounter (Signed)
Advised patient and she will call back tomorrow with a name of pulmonologist

## 2015-08-27 NOTE — Telephone Encounter (Signed)
-----   Message from Brooke Siiffany [place], MD sent at 08/26/2015  9:51 PM EDT ----- PFTs show mild obstructive lung disease.  Please refer to pulmonology for evaluation.

## 2015-08-27 NOTE — Telephone Encounter (Signed)
Follow Up: ° ° ° °Returning your call from this morning. °

## 2015-08-28 NOTE — Telephone Encounter (Signed)
Spoke with patient and she was requesting either Dr Solon AugustaBynum or Dr Jamison NeighborNestor for pulmonary Referral ordered in Epic

## 2015-08-28 NOTE — Telephone Encounter (Signed)
Pt is returning your call to speak with you about a referral. Please f/u with her  Thanks

## 2015-08-29 NOTE — Telephone Encounter (Signed)
Appointment with Dr Jamison NeighborNestor on 09/30/15, patient aware

## 2015-09-08 ENCOUNTER — Ambulatory Visit (HOSPITAL_COMMUNITY): Payer: 59 | Attending: Cardiovascular Disease

## 2015-09-08 ENCOUNTER — Other Ambulatory Visit: Payer: Self-pay

## 2015-09-08 DIAGNOSIS — I10 Essential (primary) hypertension: Secondary | ICD-10-CM | POA: Insufficient documentation

## 2015-09-08 DIAGNOSIS — D649 Anemia, unspecified: Secondary | ICD-10-CM | POA: Diagnosis not present

## 2015-09-08 DIAGNOSIS — R0602 Shortness of breath: Secondary | ICD-10-CM | POA: Diagnosis not present

## 2015-09-08 DIAGNOSIS — R002 Palpitations: Secondary | ICD-10-CM | POA: Diagnosis not present

## 2015-09-08 DIAGNOSIS — R0600 Dyspnea, unspecified: Secondary | ICD-10-CM | POA: Diagnosis present

## 2015-09-12 ENCOUNTER — Ambulatory Visit (INDEPENDENT_AMBULATORY_CARE_PROVIDER_SITE_OTHER): Payer: 59 | Admitting: Cardiovascular Disease

## 2015-09-12 ENCOUNTER — Encounter: Payer: Self-pay | Admitting: Cardiovascular Disease

## 2015-09-12 VITALS — BP 100/68 | HR 72 | Ht 64.5 in | Wt 143.0 lb

## 2015-09-12 DIAGNOSIS — R002 Palpitations: Secondary | ICD-10-CM | POA: Diagnosis not present

## 2015-09-12 DIAGNOSIS — E785 Hyperlipidemia, unspecified: Secondary | ICD-10-CM

## 2015-09-12 DIAGNOSIS — F419 Anxiety disorder, unspecified: Secondary | ICD-10-CM | POA: Diagnosis not present

## 2015-09-12 DIAGNOSIS — R072 Precordial pain: Secondary | ICD-10-CM | POA: Diagnosis not present

## 2015-09-12 MED ORDER — PRAVASTATIN SODIUM 10 MG PO TABS: 10.0000 mg | ORAL_TABLET | Freq: Every day | ORAL | Status: AC

## 2015-09-12 MED FILL — PRAVASTATIN NA 10 MG TAB: 10 | 90 days supply | Qty: 90 | Fill #0

## 2015-09-12 NOTE — Progress Notes (Signed)
Cardiology Office Note   Date:  09/12/2015   ID:  JACOBY Bright, DOB June 11, 1955, MRN [id number]  PCP:  Brooke Bright, Brooke Dach, MD  Cardiologist:   Brooke Hook, MD   Chief Complaint  Patient presents with  . Follow-up    Shortness of breath and palpatations still the same      Patient ID: Brooke Bright is a 60 y.o. female with hyperlipidemia who presents for an evaluation of multiple complaints.    Interval history 09/12/15:  Since her last appointment Brooke Bright had an echo that revealed an EF of 55-60% and grade 1 diastolic dysfunction.  A 14 day event monitor showed occasional PVCs or PACs.  She continues to have palpitations that are worse at night.  It occurs several times per week and lasts for a few seconds.  The chest pain has improved.  She noted feeling stressed and anxious at times.  She has been trying to exercise more.  Her arhritis flared due to the weather change but she is now looking forward to exercising more.   Brooke Bright cholesterol has been elevated for a long time.  Her PCP has been trying to get her to take a statin but she has been reluctant due to the potential for side effects.   Interval History 08/19/15: After his last appointment Ms. Goodell had a stress test that was negative for ischemia.  She has been doing fairly well.  She does not exertional dyspnea.  She went hiking with her husband and had to turn around when she was unable to walk up a hill.  There was also chest pain that improved after she stopped walking.  Brooke Bright continues to have palpitations.  Over he last month it has occurred nearly 6 times.  The episodes last for a few seconds and are very disconcerting for her.  There is associated chest pain and shortness of breath.  She has not noted some mild swelling in her ankles intermittently but no orthopnea or PND.  She wonders if they could be due to anxiety.  Brooke Bright has not been exercising much lately.  In the past this was  due to neck pain but lately she just hasn't felt like it.  She liked doing water aerobics before she hurt her neck.   History of Present Illness 07/29/15: Brooke Bright recently saw her OB/GYN, Brooke Bright, for check up and noted occasional palpitations.  This occurs randomly both at rest and with light exercise.  It also occurs sometimes after eating.  She sometimes feels lightheaded when it happens.  The palpitations last for approximately one minute at a time  There is some mild shortness of breath but no chest pain. Several months ago the symptoms were happening daily. However, more recently it has not happened in several weeks.  Today she noted some substernal chest discomfort that feels like heaviness when she breaths.  She also sometimes notices this substernal chest discomfort when she tries to run or exert herself.  She has not been exercising regularly lately This is mostly because of orthopedic issues and fatigue.  Brooke Bright notes occasional lower extremity edema that improves by the morning. She denies any orthopnea or PND.She had her cholesterol checked by Brooke Bright and was told that  With elevated. I recommended diet and exercise and suggested that it be repeated in one year.He checked other labs at that time but she is unaware of what was tested or the results.  Past Medical History  Diagnosis Date  . Headache(784.0)   . Normal MRI 04/2008    normal MRI of Brain  . Osteopenia   . Hyperlipidemia   . Vitamin D deficiency     low vitamin D  . Arthritis   . Palpitations 07/29/2015  . Chest pain 07/29/2015  . IBS (irritable bowel syndrome)     Past Surgical History  Procedure Laterality Date  . Knee arthroscopy  1994    left knee  . Tonsillectomy  1992     Current Outpatient Prescriptions  Medication Sig Dispense Refill  . cyclobenzaprine (FLEXERIL) 10 MG tablet Take 10 mg by mouth as needed for muscle spasms.    . fluticasone (FLONASE) 50 MCG/ACT nasal spray PLACE 2 SPRAYS  INTO THE NOSE AT BEDTIME. (Patient taking differently: PLACE 2 SPRAYS INTO THE NOSE as needed.) 16 g 6  . PROCTOSOL HC 2.5 % rectal cream Apply 1 application topically as needed.  5   No current facility-administered medications for this visit.    Allergies:   Sulfonamide derivatives    Social History:  The patient  reports that she has never smoked. She has never used smokeless tobacco. She reports that she drinks alcohol. She reports that she does not use illicit drugs.   Family History:  The patient's family history includes Arthritis in her maternal grandmother; Fibromyalgia in her mother; Luiz BlareGraves' disease in her paternal aunt; Healthy in her brother; Heart disease in her maternal grandmother; Heart failure in her maternal grandmother; Hyperlipidemia in her mother; Hypertension in her mother; Hyperthyroidism in her maternal grandmother; Osteoarthritis in her mother.    ROS:  Please see the history of present illness.   Otherwise, review of systems are positive for neck pain.   All other systems are reviewed and negative.    PHYSICAL EXAM: VS:  BP 100/68 mmHg  Pulse 72  Ht 5' 4.5" (1.638 m)  Wt 64.864 kg (143 lb)  BMI 24.18 kg/m2 , BMI Body mass index is 24.18 kg/(m^2). GENERAL:  Well appearing HEENT:  Pupils equal round and reactive, fundi not visualized, oral mucosa unremarkable NECK:  No jugular venous distention, waveform within normal limits, carotid upstroke brisk and symmetric, no bruits LYMPHATICS:  No cervical adenopathy LUNGS:  Clear to auscultation bilaterally HEART:  RRR.  PMI not displaced or sustained,S1 and S2 within normal limits, no S3, no S4, no clicks, no rubs, no murmurs ABD:  Flat, positive bowel sounds normal in frequency in pitch, no bruits, no rebound, no guarding, no midline pulsatile mass, no hepatomegaly, no splenomegaly EXT:  2 plus pulses throughout, no edema, no cyanosis no clubbing SKIN:  No rashes no nodules NEURO:  Cranial nerves II through XII  grossly intact, motor grossly intact throughout PSYCH:  Cognitively intact, oriented to person place and time  EKG:  EKG is not ordered today.  Echo 09/18/15: Study Conclusions  - Left ventricle: The cavity size was normal. Systolic function was  normal. The estimated ejection fraction was in the range of 55%  to 60%. Wall motion was normal; there were no regional wall  motion abnormalities. Doppler parameters are consistent with  abnormal left ventricular relaxation (grade 1 diastolic  dysfunction). - Mitral valve: Systolic bowing without prolapse. - Pulmonary arteries: PA peak pressure: 31 mm Hg (S).   Recent Labs: No results found for requested labs within last 365 days.    Lipid Panel    Component Value Date/Time   CHOL 287* 09/03/2008 0812   TRIG  131.0 09/03/2008 0812   HDL 64.20 09/03/2008 0812   CHOLHDL 4 09/03/2008 0812   VLDL 26.2 09/03/2008 0812   LDLDIRECT 191.8 09/03/2008 0812    06/30/15:  TSH 0.368 Total cholesterol 280, triglycerides 131, HDL 81, LDL 173    Wt Readings from Last 3 Encounters:  09/12/15 64.864 kg (143 lb)  08/19/15 65.273 kg (143 lb 14.4 oz)  08/08/15 64.864 kg (143 lb)      ASSESSMENT AND PLAN:  # Chest pain, shortness of breath: Stress test and echo have all been normal.  Ms. Mennella has been stressed and anxious.  I suspect that this has been the cause of her chest pain.  There is no evidence that she has any obstructive coronary disease.  # Palpitations: Ms. Veneziano has PAcs and PVC.  However, her blood pressure is low-normal.  Therefore, we will not start any nodal agents.  We discussed the fact that these are not dangerous and may also be exacerbated by stress and anxiety.  She expressed understanding.    # Hyperlipidemia: Ms. Sandhu is willing to try a statin along with diet and exercise.  We will start pravastatin 10 mg daily and check lipids and LFTs in 6 weeks.   Current medicines are reviewed at length with the patient  today.  The patient does not have concerns regarding medicines.  The following changes have been made:  no change  Labs/ tests ordered today include:   No orders of the defined types were placed in this encounter.   Time spent: 25 minutes-Greater than 50% of this time was spent in counseling, explanation of diagnosis, planning of further management, and coordination of care.   Disposition:   FU with Genasis Zingale C. Duke Salvia, MD, [facility] in 6 months.   This note was written with the assistance of speech recognition software.  Please excuse any transcriptional errors.  Signed, Fran Neiswonger C. Duke Salvia, MD, [facility]  09/12/2015 3:28 PM    [facility]

## 2015-09-12 NOTE — Patient Instructions (Signed)
Medication Instructions:  START PRAVASTATIN 10 MG DAILY  Labwork: FASTING LP/HFP IN 6 WEEKS AT [facility] ON THE FIRST FLOOR  Testing/Procedures: NONE  Follow-Up: Your physician wants you to follow-up in: 6 MONTH OV You will receive a reminder letter in the mail two months in advance. If you don't receive a letter, please call our office to schedule the follow-up appointment.   Fat and Cholesterol Restricted Diet High levels of fat and cholesterol in your blood may lead to various health problems, such as diseases of the heart, blood vessels, gallbladder, liver, and pancreas. Fats are concentrated sources of energy that come in various forms. Certain types of fat, including saturated fat, may be harmful in excess. Cholesterol is a substance needed by your body in small amounts. Your body makes all the cholesterol it needs. Excess cholesterol comes from the food you eat. When you have high levels of cholesterol and saturated fat in your blood, health problems can develop because the excess fat and cholesterol will gather along the walls of your blood vessels, causing them to narrow. Choosing the right foods will help you control your intake of fat and cholesterol. This will help keep the levels of these substances in your blood within normal limits and reduce your risk of disease.  WHAT TYPES OF FAT SHOULD I CHOOSE?  Choose healthy fats more often. Choose monounsaturated and polyunsaturated fats, such as olive and canola oil, flaxseeds, walnuts, almonds, and seeds.  Eat more omega-3 fats. Good choices include salmon, mackerel, sardines, tuna, flaxseed oil, and ground flaxseeds. Aim to eat fish at least two times a week.  Limit saturated fats. Saturated fats are primarily found in animal products, such as meats, butter, and cream. Plant sources of saturated fats include palm oil, palm kernel oil, and coconut oil.  Avoid foods with partially hydrogenated oils in them. These contain trans fats.  Examples of foods that contain trans fats are stick margarine, some tub margarines, cookies, crackers, and other baked goods. WHAT GENERAL GUIDELINES DO I NEED TO FOLLOW? These guidelines for healthy eating will help you control your intake of fat and cholesterol:  Check food labels carefully to identify foods with trans fats or high amounts of saturated fat.  Fill one half of your plate with vegetables and green salads.  Fill one fourth of your plate with whole grains. Look for the word "whole" as the first word in the ingredient list.  Fill one fourth of your plate with lean protein foods.  Limit fruit to two servings a day. Choose fruit instead of juice.  Eat more foods that contain soluble fiber. Examples of foods that contain this type of fiber are apples, broccoli, carrots, beans, peas, and barley. Aim to get 20-30 g of fiber per day.  Eat more home-cooked food and less restaurant, buffet, and fast food.  Limit or avoid alcohol.  Limit foods high in starch and sugar.  Limit fried foods.  Cook foods using methods other than frying. Baking, boiling, grilling, and broiling are all great options.  Lose weight if you are overweight. Losing just 5-10% of your initial body weight can help your overall health and prevent diseases such as diabetes and heart disease. WHAT FOODS CAN I EAT? Grains Whole grains, such as whole wheat or whole grain breads, crackers, cereals, and pasta. Unsweetened oatmeal, bulgur, barley, quinoa, or brown rice. Corn or whole wheat flour tortillas. Vegetables Fresh or frozen vegetables (raw, steamed, roasted, or grilled). Green salads. Fruits All fresh, canned (  in natural juice), or frozen fruits. Meat and Other Protein Products Ground beef (85% or leaner), grass-fed beef, or beef trimmed of fat. Skinless chicken or [place]. Ground chicken or [place]. Pork trimmed of fat. All fish and seafood. Eggs. Dried beans, peas, or lentils. Unsalted nuts or seeds.  Unsalted canned or dry beans. Dairy Low-fat dairy products, such as skim or 1% milk, 2% or reduced-fat cheeses, low-fat ricotta or cottage cheese, or plain low-fat yogurt. Fats and Oils Tub margarines without trans fats. Light or reduced-fat mayonnaise and salad dressings. Avocado. Olive, canola, sesame, or safflower oils. Natural peanut or almond butter (choose ones without added sugar and oil). The items listed above may not be a complete list of recommended foods or beverages. Contact your dietitian for more options. WHAT FOODS ARE NOT RECOMMENDED? Grains White bread. White pasta. White rice. Cornbread. Bagels, pastries, and croissants. Crackers that contain trans fat. Vegetables White potatoes. Corn. Creamed or fried vegetables. Vegetables in a cheese sauce. Fruits Dried fruits. Canned fruit in light or heavy syrup. Fruit juice. Meat and Other Protein Products Fatty cuts of meat. Ribs, chicken wings, bacon, sausage, bologna, salami, chitterlings, fatback, hot dogs, bratwurst, and packaged luncheon meats. Liver and organ meats. Dairy Whole or 2% milk, cream, half-and-half, and cream cheese. Whole milk cheeses. Whole-fat or sweetened yogurt. Full-fat cheeses. Nondairy creamers and whipped toppings. Processed cheese, cheese spreads, or cheese curds. Sweets and Desserts Corn syrup, sugars, honey, and molasses. Candy. Jam and jelly. Syrup. Sweetened cereals. Cookies, pies, cakes, donuts, muffins, and ice cream. Fats and Oils Butter, stick margarine, lard, shortening, ghee, or bacon fat. Coconut, palm kernel, or palm oils. Beverages Alcohol. Sweetened drinks (such as sodas, lemonade, and fruit drinks or punches). The items listed above may not be a complete list of foods and beverages to avoid. Contact your dietitian for more information.   This information is not intended to replace advice given to you by your health care provider. Make sure you discuss any questions you have with your health  care provider.   Document Released: 05/24/2005 Document Revised: 06/14/2014 Document Reviewed: 08/22/2013 Elsevier Interactive Patient Education [employer]

## 2015-09-13 ENCOUNTER — Encounter: Payer: Self-pay | Admitting: Cardiovascular Disease

## 2015-09-14 ENCOUNTER — Encounter: Payer: Self-pay | Admitting: Cardiovascular Disease

## 2015-09-19 ENCOUNTER — Telehealth: Payer: Self-pay | Admitting: *Deleted

## 2015-09-19 IMAGING — CT CT L SPINE W/O CM
3 series · 10 of 33 positions shown, 12 images · non-contrast
Comparison: MR 04/25/2012

CLINICAL DATA: Back injury R leg parethesia. R/o disc protrusion
Back injury R leg parethesia. R/o disc protrusion

EXAM:
CT LUMBAR SPINE WITHOUT CONTRAST
TECHNIQUE: Multidetector CT imaging of the lumbar spine was performed without
intravenous contrast administration. Multiplanar CT image
reconstructions were also generated.

[Series 3: spine 2.0 b31s st · axial · 0.23mm/px · z∈[+970,+1088]mm · 2 of 126 slices shown, 3 images]
[im 39/126  soft-tissue]
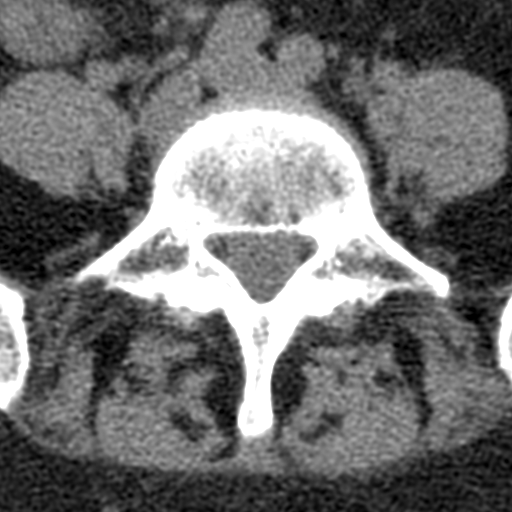
[im 39/126  bone]
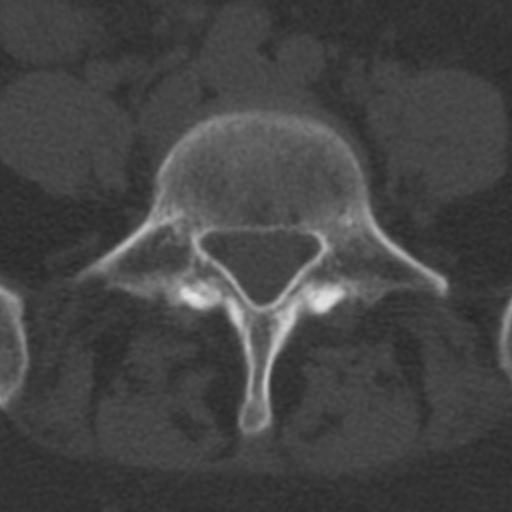
[im 97/126  bone]
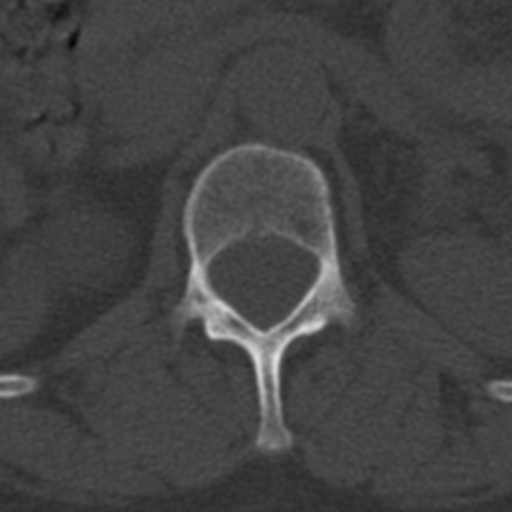

[Series 6: spine 2.0 coronal st · coronal · 0.29mm/px · 3 of 53 slices shown]
[im 11/53  bone]
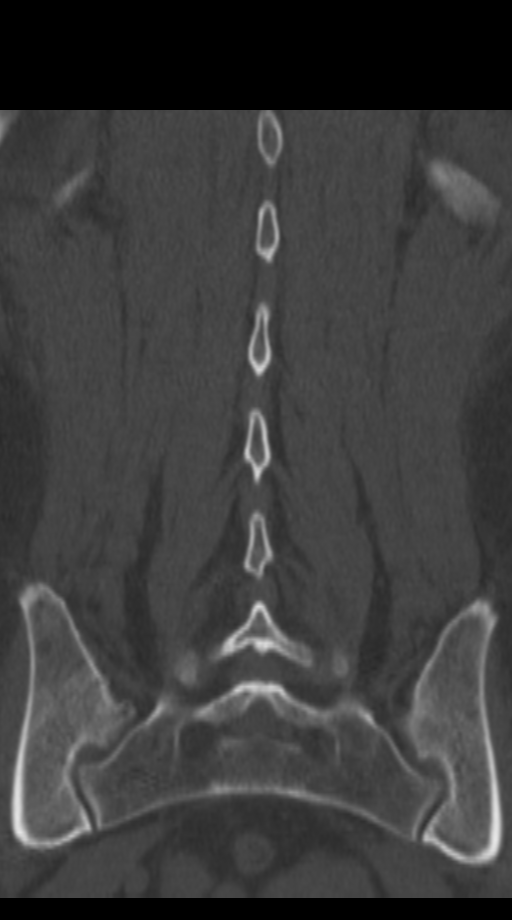
[im 21/53  bone]
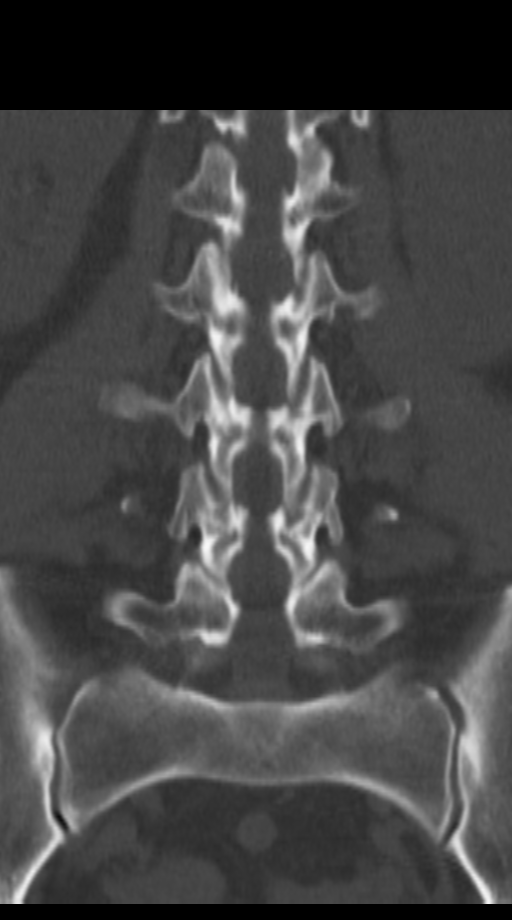
[im 32/53  bone]
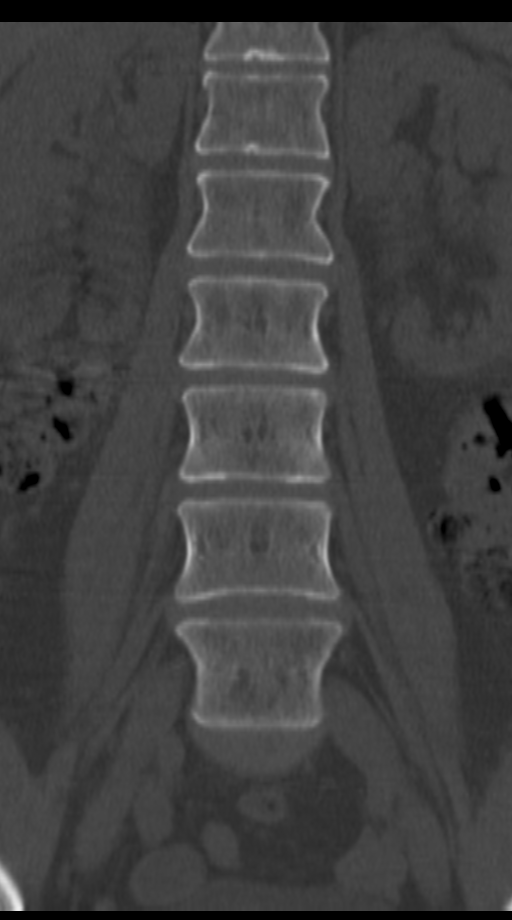

[Series 7: spine 2.0 sagittal st · sagittal · 0.26mm/px · 5 of 52 slices shown, 6 images]
[im 18/52  bone]
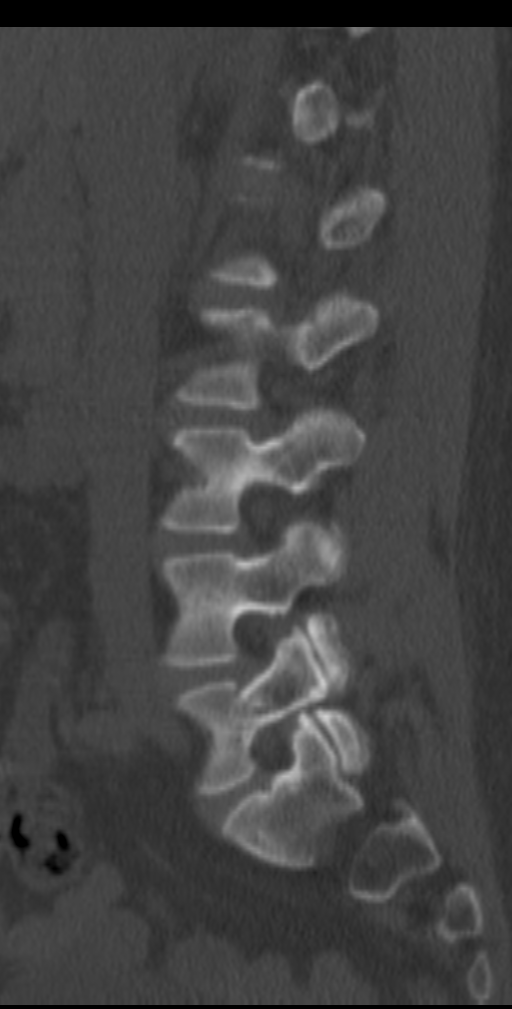
[im 22/52  bone]
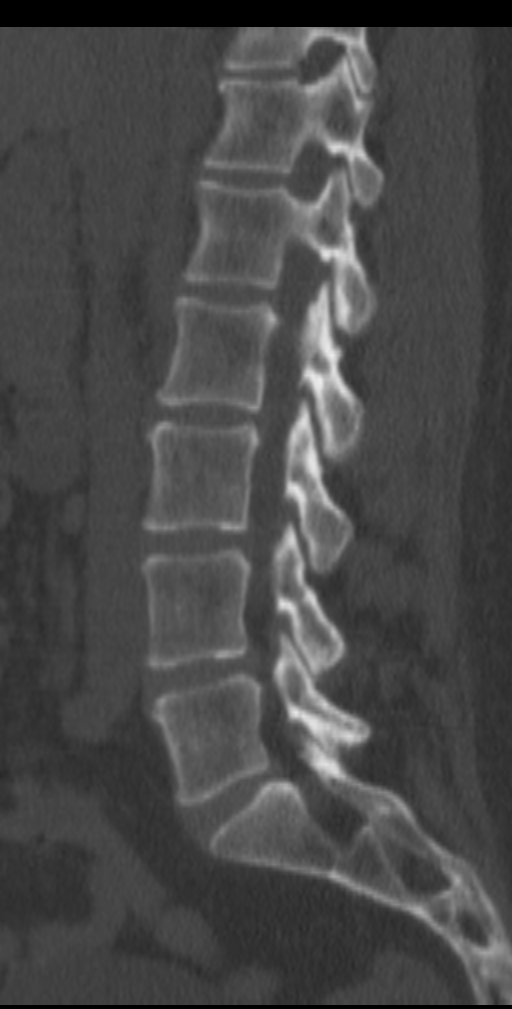
[im 26/52  soft-tissue]
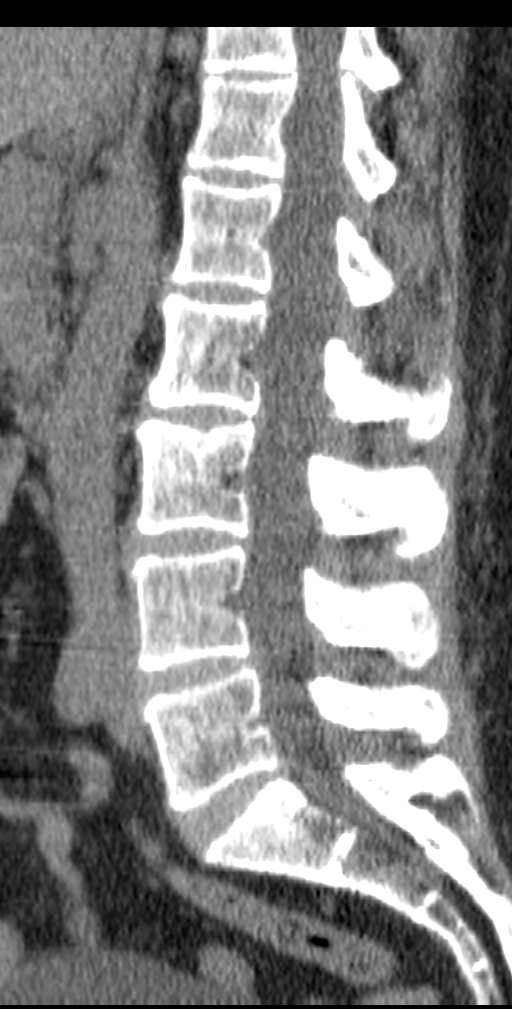
[im 26/52  bone]
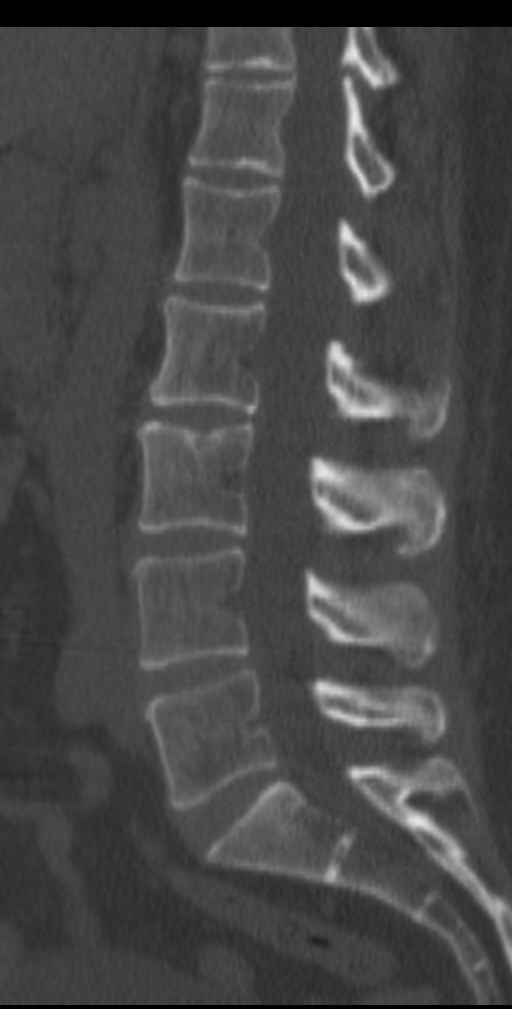
[im 30/52  bone]
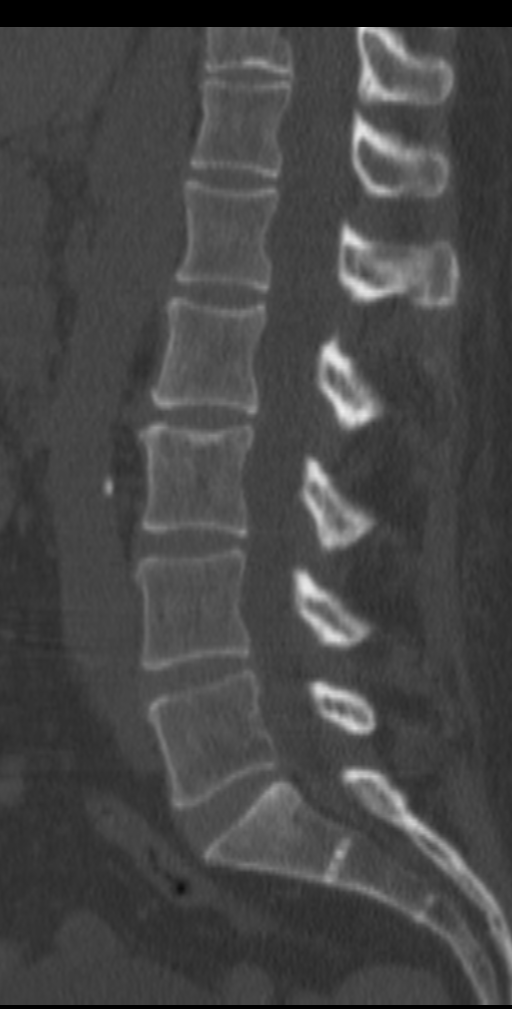
[im 35/52  bone]
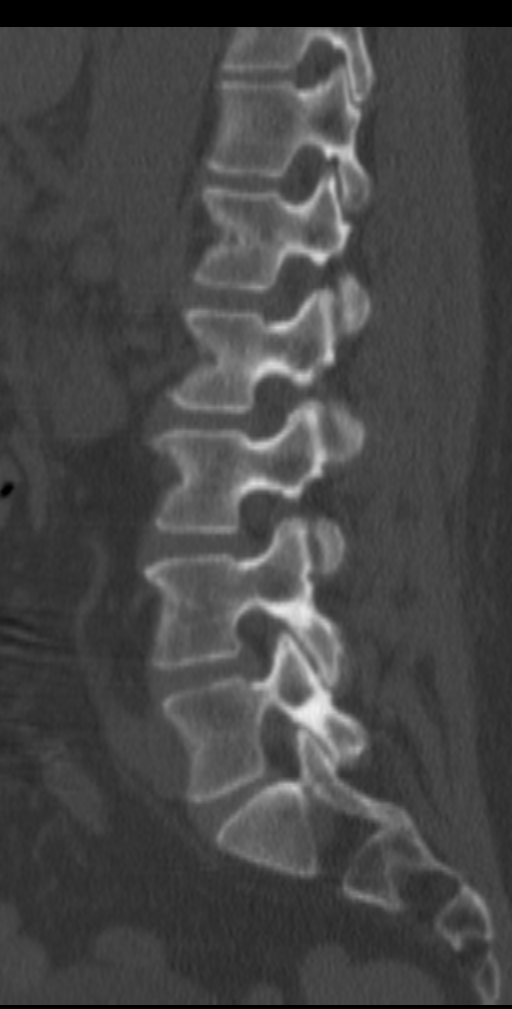

[10 of 33 positions shown; findings below may reference images not displayed]

FINDINGS: Numbering scheme follows that used on prior study. Normal alignment.
Negative for fracture. Mild circumferential disc bulge L2-3. Small
Schmorl's node in the superior endplate of L3 as before. Vertebral
body and disc height maintained throughout. Mild facet degenerative
hypertrophy bilaterally L5-S1. No significant foraminal osseous
stenosis. Regional soft tissues unremarkable.
IMPRESSION: 1. Negative for fracture or other acute abnormality.
2. Mild degenerative changes as above, largely stable since previous
exam.

## 2015-09-19 NOTE — Telephone Encounter (Signed)
[facility]  14 day Event Monitor  Quality: Fair. Baseline artifact. Predominant rhythm: sinus  Occasional PVCs and PACs were noted. Patient noted symptoms with these rhythms.   Tiffany C. Duke Salviaandolph, MD, [facility] 09/09/2015 4:47 PM

## 2015-09-22 DIAGNOSIS — L700 Acne vulgaris: Secondary | ICD-10-CM | POA: Diagnosis not present

## 2015-09-22 DIAGNOSIS — L820 Inflamed seborrheic keratosis: Secondary | ICD-10-CM | POA: Diagnosis not present

## 2015-09-22 DIAGNOSIS — L821 Other seborrheic keratosis: Secondary | ICD-10-CM | POA: Diagnosis not present

## 2015-09-22 DIAGNOSIS — L812 Freckles: Secondary | ICD-10-CM | POA: Diagnosis not present

## 2015-09-22 DIAGNOSIS — L570 Actinic keratosis: Secondary | ICD-10-CM | POA: Diagnosis not present

## 2015-09-22 DIAGNOSIS — L720 Epidermal cyst: Secondary | ICD-10-CM | POA: Diagnosis not present

## 2015-09-22 MED FILL — CLINDAMYCIN PH 1% SOLUTION: 1 | 30 days supply | Qty: 60 | Fill #0

## 2015-09-30 ENCOUNTER — Encounter: Payer: Self-pay | Admitting: Pulmonary Disease

## 2015-09-30 ENCOUNTER — Ambulatory Visit (INDEPENDENT_AMBULATORY_CARE_PROVIDER_SITE_OTHER)
Admission: RE | Admit: 2015-09-30 | Discharge: 2015-09-30 | Disposition: A | Discharge status: Home / Self Care | Payer: 59 | Source: Ambulatory Visit | Attending: Pulmonary Disease | Admitting: Pulmonary Disease

## 2015-09-30 ENCOUNTER — Other Ambulatory Visit (INDEPENDENT_AMBULATORY_CARE_PROVIDER_SITE_OTHER): Payer: 59

## 2015-09-30 ENCOUNTER — Ambulatory Visit (INDEPENDENT_AMBULATORY_CARE_PROVIDER_SITE_OTHER): Payer: 59 | Admitting: Pulmonary Disease

## 2015-09-30 VITALS — BP 128/74 | HR 62 | Ht 64.0 in | Wt 142.0 lb

## 2015-09-30 DIAGNOSIS — J302 Other seasonal allergic rhinitis: Secondary | ICD-10-CM

## 2015-09-30 DIAGNOSIS — R0600 Dyspnea, unspecified: Secondary | ICD-10-CM

## 2015-09-30 DIAGNOSIS — M1990 Unspecified osteoarthritis, unspecified site: Secondary | ICD-10-CM

## 2015-09-30 DIAGNOSIS — R0602 Shortness of breath: Secondary | ICD-10-CM | POA: Diagnosis not present

## 2015-09-30 LAB — CBC WITH DIFFERENTIAL/PLATELET
BASOS ABS: 0 10*3/uL (ref 0.0–0.1)
BASOS PCT: 0.3 % (ref 0.0–3.0)
EOS ABS: 0 10*3/uL (ref 0.0–0.7)
Eosinophils Relative: 0.9 % (ref 0.0–5.0)
HEMATOCRIT: 39.1 % (ref 36.0–46.0)
HEMOGLOBIN: 13.2 g/dL (ref 12.0–15.0)
LYMPHS PCT: 43.3 % (ref 12.0–46.0)
Lymphs Abs: 2.2 10*3/uL (ref 0.7–4.0)
MCHC: 33.8 g/dL (ref 30.0–36.0)
MCV: 96.4 fl (ref 78.0–100.0)
Monocytes Absolute: 0.6 10*3/uL (ref 0.1–1.0)
Monocytes Relative: 11.3 % (ref 3.0–12.0)
Neutro Abs: 2.2 10*3/uL (ref 1.4–7.7)
Neutrophils Relative %: 44.2 % (ref 43.0–77.0)
Platelets: 236 10*3/uL (ref 150.0–400.0)
RBC: 4.06 Mil/uL (ref 3.87–5.11)
RDW: 12.4 % (ref 11.5–15.5)
WBC: 5 10*3/uL (ref 4.0–10.5)

## 2015-09-30 NOTE — Patient Instructions (Addendum)
   Our office will call you with the results of your x-ray today.  Please call me if you have any new breathing problems before your next appointment  I will see you back in 1-2 months to review the results of your testing or sooner if needed  TESTS ORDERED: 1. Chest x-ray PA/LAT today 2. Serum CBC with differential & ANA with reflex to comprehensive panel today

## 2015-09-30 NOTE — Progress Notes (Signed)
Subjective:    Patient ID: Brooke Bright, female    DOB: 1955-07-18, 60 y.o.   MRN: [id number]  HPI As a child and young adult she had no breathing problems. She has had seasonal allergies and took allergy shots as a young adult. She reports she had a severe respiratory illness after traveling to [place] around 2006.  Since then she has noted more breathing problems. She reports she does have dyspnea, especially on exertion, that seems to be gradually worsening. She does have a cough that is nonproductive, occasionally with exercise. She also had a rare cough in the morning or late evening. She reports she usually gets bronchitis, primarily in the Fall, but generally once a year. She will have a lingering cough that continues after these episodes of bronchitis. She does feel like when she has had steroids in the past her symptoms resolved quickly. Denies any wheezing. She does have tightness with her dyspnea feeling like she cannot do deep breathing. She has also had chest pain and heaviness in the past. She has had intermittent palpitations in the past as well. She still has significant sinus congestion & post nasal drainage. Denies any reflux or dyspepsia recently. She does report occasional dysphagia with certain foods. Infrequent odynophagia. She has pain primarily in her neck, hands, and shoulders. She reports she has significant soreness in her hands that prohibits her from doing housework.   Review of Systems No rashes recently. She does have periodic rashes that itchy and come up on her back and legs. No adenopathy in her neck, groin, or axilla. A pertinent 14 point review of systems is negative except as per the history of presenting illness.  Allergies  Allergen Reactions  . Sulfonamide Derivatives     Current Outpatient Prescriptions on File Prior to Visit  Medication Sig Dispense Refill  . cyclobenzaprine (FLEXERIL) 10 MG tablet Take 10 mg by mouth as needed for muscle spasms.    .  fluticasone (FLONASE) 50 MCG/ACT nasal spray PLACE 2 SPRAYS INTO THE NOSE AT BEDTIME. (Patient taking differently: PLACE 2 SPRAYS INTO THE NOSE as needed.) 16 g 6  . pravastatin (PRAVACHOL) 10 MG tablet Take 1 tablet (10 mg total) by mouth daily. 90 tablet 1  . PROCTOSOL HC 2.5 % rectal cream Apply 1 application topically as needed.  5   No current facility-administered medications on file prior to visit.    Past Medical History  Diagnosis Date  . Headache(784.0)   . Normal MRI 04/2008    normal MRI of Brain  . Osteopenia   . Hyperlipidemia   . Vitamin D deficiency     low vitamin D  . Arthritis   . Palpitations 07/29/2015  . Chest pain 07/29/2015  . IBS (irritable bowel syndrome)     Past Surgical History  Procedure Laterality Date  . Knee arthroscopy  1994    left knee  . Tonsillectomy  1992    Family History  Problem Relation Age of Onset  . Hypertension Mother   . Fibromyalgia Mother   . Osteoarthritis Mother   . Hyperlipidemia Mother   . Graves' disease Paternal Aunt   . Immunodeficiency      auto immune disease-father side of family  . Rheum arthritis    . Healthy Brother   . Heart disease Maternal Grandmother   . Heart failure Maternal Grandmother   . Hyperthyroidism Maternal Grandmother   . Arthritis Maternal Grandmother   . Graves' disease Maternal Grandmother   .  Lupus Maternal Uncle   . Lung disease Neg Hx     Social History   Social History  . Marital Status: Married    Spouse Name: N/A  . Number of Children: N/A  . Years of Education: N/A   Social History Main Topics  . Smoking status: Never Smoker   . Smokeless tobacco: Never Used  . Alcohol Use: 0.0 oz/week    0 Standard drinks or equivalent per week     Comment: rare glass of wine on weekends  . Drug Use: No  . Sexual Activity: Not Asked   Other Topics Concern  . 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 She has traveled to [place], [place], & [place]. 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 She has a Nurse, mental healthdog. No bird exposure. No notable mold exposure. Previous hot tub exposure.       Objective:   Physical Exam BP 128/74 mmHg  Pulse 62  Ht 5\' 4"  (1.626 m)  Wt 142 lb (64.411 kg)  BMI 24.36 kg/m2  SpO2 97% General:  Awake. Alert. No acute distress. Thin Caucasian female.  Integument:  Warm & dry. No rash on exposed skin.  Lymphatics:  No appreciated cervical or supraclavicular lymphadenoapthy. HEENT:  Moist mucus membranes. No oral ulcers. No scleral injection or icterus. No nasal turbinate swelling. Cardiovascular:  Regular rate. No edema. No appreciable JVD.  Pulmonary:  Good aeration & clear to auscultation bilaterally. Symmetric chest wall expansion. No accessory muscle use. Abdomen: Soft. Normal bowel sounds. Nondistended. Grossly nontender. Musculoskeletal:  Normal bulk and tone. Hand grip strength 5/5 bilaterally. No joint deformity or effusion appreciated. Neurological:  CN 2-12 grossly in tact. No meningismus. Moving all 4 extremities equally. Symmetric brachioradialis deep tendon reflexes. Psychiatric:  Mood and affect congruent. Speech normal rhythm, rate & tone.   PFT 08/26/15: FVC 3.13 L (96%) FEV1 2.55 L (101%) FEV1/FVC 0.81 FEF 25-75 2.67 L (118%) no bronchodilator response TLC 4.81 L (95%) RV 84% ERV 46% DLCO uncorrected 93%   CARDIAC TTE (09/08/15): LV normal in size with EF 55-60% no regional wall motion abnormalities. Grade 1 diastolic dysfunction. LA & RA normal in size. RV normal in size and function. No aortic stenosis or regurgitation. Trivial mitral regurgitation  without stenosis. No pulmonic stenosis with trivial regurgitation. Trivial tricuspid regurgitation. No pericardial effusion.  14 DAY HOLTER MONITOR (08/21/15): Occasional PVCs & PACs noted with patient noted symptoms. Predominant rhythm sinus.  MYOCARDIAL PERFUSION (08/08/15): EF 51% with normal LV function. No evidence of ischemia.  LABS 03/03/10 ANA:  Positive (1:160) RF:  <20    Assessment & Plan:   60 year old female with long-standing history of yearly bronchitis. She has also noted increasing dyspnea on exertion. With her 6680s history of seasonal and environmental allergies I feel that underlying asthma is certainly possible. However, I would be more suspicious of a possible autoimmune disease with pulmonary manifestations given her weakly positive ANA in the past as  well as family history of lupus and rheumatoid arthritis. I reviewed her previous pulmonary function testing which showed no significant airway obstruction and with normal lung volumes and carbon monoxide diffusion capacity argues against a parenchymal abnormality. I instructed the patient contact my office if she had any new breathing problems before her next appointment.   1. Dyspnea: Unclear etiology. Checking chest x-ray PA/LAT & CBC with differential today. Consider methacholine challenge test versus cardiopulmonary exercise testing at next appointment. 2. Allergic rhinitis: Previously received immunotherapy. 3. Arthritis: Checking serum ANA with reflex to comprehensive panel. 4. Follow-up: Patient to return to clinic 1-2 months or sooner if needed.   Donna Christen Jamison Neighbor, M.D. [facility] Pulmonary & Critical Care Pager:  [phone] After 3pm or if no response, call [phone] 5:19 PM 09/30/2015

## 2015-10-02 ENCOUNTER — Telehealth: Payer: Self-pay | Admitting: Pulmonary Disease

## 2015-10-02 NOTE — Progress Notes (Signed)
Quick Note:  Spoke with pt and notified of results per Dr. Nestor. Pt verbalized understanding and denied any questions.  ______ 

## 2015-10-02 NOTE — Telephone Encounter (Signed)
Please call the patient and let her know that her blood counts were normal and she's not anemic. Also let her know I reviewed her CXR and there are no abnormalities. Thanks  Spoke with pt and notified of results per Dr. Jamison NeighborNestor. Pt verbalized understanding and denied any questions.

## 2015-10-02 NOTE — Progress Notes (Signed)
Quick Note:  LMTCB ______ 

## 2015-10-06 DIAGNOSIS — M542 Cervicalgia: Secondary | ICD-10-CM | POA: Diagnosis not present

## 2015-10-06 DIAGNOSIS — M797 Fibromyalgia: Secondary | ICD-10-CM | POA: Diagnosis not present

## 2015-10-06 DIAGNOSIS — M546 Pain in thoracic spine: Secondary | ICD-10-CM | POA: Diagnosis not present

## 2015-10-06 DIAGNOSIS — M545 Low back pain: Secondary | ICD-10-CM | POA: Diagnosis not present

## 2015-10-06 MED FILL — predniSONE 5 MG TABS: 5 | 8 days supply | Qty: 20 | Fill #0

## 2015-10-06 MED FILL — MELOXICAM 15 MG TABLET: 15 | 30 days supply | Qty: 30 | Fill #0

## 2015-10-06 MED FILL — tiZANidine HCL 4 MG TABS: 4 | 30 days supply | Qty: 30 | Fill #0

## 2015-10-07 LAB — ANTINUCLEAR ANTIBODIES, IFA

## 2015-11-07 MED FILL — PROCTOSOL-HC 2.5% CREAM: 2.5 | 15 days supply | Qty: 28 | Fill #1

## 2015-11-07 MED FILL — CLOTRIMAZOLE-BETAMETHASONE: 1-0.05 | 7 days supply | Qty: 15 | Fill #1

## 2015-11-11 ENCOUNTER — Ambulatory Visit (INDEPENDENT_AMBULATORY_CARE_PROVIDER_SITE_OTHER): Payer: 59 | Admitting: Pulmonary Disease

## 2015-11-11 ENCOUNTER — Encounter: Payer: Self-pay | Admitting: Pulmonary Disease

## 2015-11-11 ENCOUNTER — Telehealth: Payer: Self-pay | Admitting: Pulmonary Disease

## 2015-11-11 VITALS — BP 126/68 | HR 75 | Ht 64.5 in | Wt 141.0 lb

## 2015-11-11 DIAGNOSIS — J309 Allergic rhinitis, unspecified: Secondary | ICD-10-CM | POA: Diagnosis not present

## 2015-11-11 DIAGNOSIS — R0600 Dyspnea, unspecified: Secondary | ICD-10-CM | POA: Diagnosis not present

## 2015-11-11 NOTE — Progress Notes (Signed)
Subjective:    Patient ID: Brooke Bright, female    DOB: [birth year], 60 y.o.   MRN: [id number]  C.C.:  Follow-up for Dyspnea & Allergic Rhinitis.  HPI Dyspnea/Probable Asthma:  She reports her dyspnea spontaneously resolved since last appointment. She denies any coughing or wheezing. She has taken no new medications. She did have a course of prednisone for joint pain in early May around the same time as her breathing improved.   Allergic Rhinitis:  Previously on immunotherapy. She reports her sinus congestion & drainage has improved. She has been using her Flonase intermittently.   Review of Systems No fever, chills, or sweats. No chest pain or pressure.   Allergies  Allergen Reactions  . Sulfonamide Derivatives     Current Outpatient Prescriptions on File Prior to Visit  Medication Sig Dispense Refill  . cyclobenzaprine (FLEXERIL) 10 MG tablet Take 10 mg by mouth as needed for muscle spasms.    . fluticasone (FLONASE) 50 MCG/ACT nasal spray PLACE 2 SPRAYS INTO THE NOSE AT BEDTIME. (Patient taking differently: PLACE 2 SPRAYS INTO THE NOSE as needed.) 16 g 6  . pravastatin (PRAVACHOL) 10 MG tablet Take 1 tablet (10 mg total) by mouth daily. 90 tablet 1  . PROCTOSOL HC 2.5 % rectal cream Apply 1 application topically as needed.  5   No current facility-administered medications on file prior to visit.    Past Medical History  Diagnosis Date  . Headache(784.0)   . Normal MRI 04/2008    normal MRI of Brain  . Osteopenia   . Hyperlipidemia   . Vitamin D deficiency     low vitamin D  . Arthritis   . Palpitations 07/29/2015  . Chest pain 07/29/2015  . IBS (irritable bowel syndrome)     Past Surgical History  Procedure Laterality Date  . Knee arthroscopy  1994    left knee  . Tonsillectomy  1992    Family History  Problem Relation Age of Onset  . Hypertension Mother   . Fibromyalgia Mother   . Osteoarthritis Mother   . Hyperlipidemia Mother   . Graves' disease  Paternal Aunt   . Immunodeficiency      auto immune disease-father side of family  . Rheum arthritis    . Healthy Brother   . Heart disease Maternal Grandmother   . Heart failure Maternal Grandmother   . Hyperthyroidism Maternal Grandmother   . Arthritis Maternal Grandmother   . Graves' disease Maternal Grandmother   . Lupus Maternal Uncle   . Lung disease Neg Hx     Social History   Social History  . Marital Status: Married    Spouse Name: N/A  . Number of Children: N/A  . Years of Education: N/A   Social History Main Topics  . Smoking status: Never Smoker   . Smokeless tobacco: Never Used  . Alcohol Use: 0.0 oz/week    0 Standard drinks or equivalent per week     Comment: rare glass of wine on weekends  . Drug Use: No  . Sexual Activity: Not Asked   Other Topics Concern  . 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 Previously has lived in [place] & [place]. She has traveled to [place], [place], & [place]. 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 She has a Nurse, mental health. No bird exposure. No notable mold exposure. Previous hot tub exposure.       Objective:   Physical Exam BP 126/68 mmHg  Pulse 75  Ht 5' 4.5" (1.638 m)  Wt 141 lb (63.957 kg)  BMI 23.84 kg/m2  SpO2 95% General:  Awake. Alert. No distress. Integument:  Warm & dry. No rash on exposed skin.  Lymphatics:  No appreciated cervical or supraclavicular lymphadenoapthy. HEENT:  Moist mucus membranes. No oral ulcers. No scleral injection. No nasal turbinate swelling. Cardiovascular:  Regular rate. No edema. No appreciable JVD.  Pulmonary:  Clear bilaterally to auscultation.  Normal work of breathing on room air. Speaking in complete sentences.  PFT 08/26/15: FVC 3.13 L (96%) FEV1 2.55 L (101%) FEV1/FVC 0.81 FEF 25-75 2.67 L (118%) no bronchodilator response TLC 4.81 L (95%) RV 84% ERV 46% DLCO uncorrected 93%   IMAGING CXR PA/LAT 09/30/15 (personally reviewed by me): No focal opacity or effusion appreciated. Heart normal in size & mediastinum normal in contour.   CARDIAC TTE (09/08/15): LV normal in size with EF 55-60% no regional wall motion abnormalities. Grade 1 diastolic dysfunction. LA & RA normal in size. RV normal in size and function. No aortic stenosis or regurgitation. Trivial mitral regurgitation without stenosis. No pulmonic stenosis with trivial regurgitation. Trivial tricuspid regurgitation. No pericardial effusion.  14 DAY HOLTER MONITOR (08/21/15): Occasional PVCs & PACs noted with patient noted symptoms. Predominant rhythm sinus.  MYOCARDIAL PERFUSION (08/08/15): EF 51% with normal LV function. No evidence of ischemia.  LABS 09/30/15 CBC: 5.0/13.2/39.1/236  03/03/10 ANA:  Positive (1:160) RF:  <20    Assessment & Plan:   60 year old female with probable underlying asthma as a cause for her previous dyspnea. Allergic rhinitis seems to be well-controlled on Flonase at this time. She still has some sensitivity to inhaled fumes and chemical irritants, but otherwise, she is largely asymptomatic. As such, I am not starting the patient on any inhaled medications at this time. We did briefly discuss performing methacholine challenge testing to evaluate formally for underlying asthma, but the patient would prefer to hold off on testing at this time and we will simply be performing spirometry with bronchodilator challenge in the late Fall which is usually when the patient's symptoms begin. I instructed the patient contact my office if she has any new breathing problems or questions before her next appointment.  1. Dyspnea/Probable Asthma:  Plan to repeat  spirometry with bronchodilator challenge at follow-up appointment. 2. Allergic rhinitis: Controlled with Flonase. No changes at this time. 3. Follow-up: Patient to return to clinic  October-November 2017 or sooner if needed.  Donna Christen Jamison Neighbor, M.D. [facility] Pulmonary & Critical Care Pager:  [phone] After 3pm or if no response, call [phone] 3:38 PM 11/11/2015

## 2015-11-11 NOTE — Telephone Encounter (Signed)
IMAGING CXR PA/LAT 09/30/15 (personally reviewed by me):  No focal opacity or effusion appreciated. Heart normal in size & mediastinum normal in contour.   LABS 09/30/15 CBC:  5.0/13.2/39.1/236

## 2015-11-11 NOTE — Patient Instructions (Addendum)
   Call me if you have any new breathing problems before your next appointment  I will see you back in October-November or sooner if needed.  TESTS ORDERED: 1. Spirometry with bronchodilator challenge at follow-up appointment

## 2015-11-11 NOTE — Addendum Note (Signed)
Addended by: Maxwell MarionBLANKENSHIP, Ayden Hardwick A on: 11/11/2015 03:40 PM   Modules accepted: Orders

## 2016-03-12 ENCOUNTER — Encounter: Payer: Self-pay | Admitting: Pulmonary Disease

## 2016-03-12 ENCOUNTER — Ambulatory Visit (INDEPENDENT_AMBULATORY_CARE_PROVIDER_SITE_OTHER): Payer: 59 | Admitting: Pulmonary Disease

## 2016-03-12 VITALS — BP 104/66 | HR 68 | Ht 64.0 in | Wt 141.0 lb

## 2016-03-12 DIAGNOSIS — K219 Gastro-esophageal reflux disease without esophagitis: Secondary | ICD-10-CM | POA: Diagnosis not present

## 2016-03-12 DIAGNOSIS — R1310 Dysphagia, unspecified: Secondary | ICD-10-CM

## 2016-03-12 DIAGNOSIS — R0600 Dyspnea, unspecified: Secondary | ICD-10-CM

## 2016-03-12 DIAGNOSIS — R1319 Other dysphagia: Secondary | ICD-10-CM

## 2016-03-12 DIAGNOSIS — J4520 Mild intermittent asthma, uncomplicated: Secondary | ICD-10-CM | POA: Diagnosis not present

## 2016-03-12 DIAGNOSIS — J309 Allergic rhinitis, unspecified: Secondary | ICD-10-CM

## 2016-03-12 LAB — PULMONARY FUNCTION TEST
FEF 25-75 POST: 2.53 L/s
FEF 25-75 PRE: 2.53 L/s
FEF2575-%CHANGE-POST: 0 %
FEF2575-%PRED-PRE: 112 %
FEF2575-%Pred-Post: 113 %
FEV1-%Change-Post: 0 %
FEV1-%PRED-POST: 100 %
FEV1-%Pred-Pre: 99 %
FEV1-PRE: 2.47 L
FEV1-Post: 2.48 L
FEV1FVC-%CHANGE-POST: 3 %
FEV1FVC-%PRED-PRE: 104 %
FEV6-%CHANGE-POST: -2 %
FEV6-%PRED-PRE: 97 %
FEV6-%Pred-Post: 94 %
FEV6-PRE: 3.02 L
FEV6-Post: 2.94 L
FEV6FVC-%Change-Post: 0 %
FEV6FVC-%Pred-Post: 104 %
FEV6FVC-%Pred-Pre: 103 %
FVC-%Change-Post: -2 %
FVC-%PRED-PRE: 93 %
FVC-%Pred-Post: 91 %
FVC-POST: 2.94 L
FVC-Pre: 3.03 L
POST FEV1/FVC RATIO: 84 %
POST FEV6/FVC RATIO: 100 %
PRE FEV1/FVC RATIO: 81 %
Pre FEV6/FVC Ratio: 100 %

## 2016-03-12 MED ORDER — RANITIDINE HCL 150 MG PO TABS: 150.0000 mg | ORAL_TABLET | Freq: Every day | ORAL | 3 refills | Status: AC

## 2016-03-12 MED ORDER — PANTOPRAZOLE SODIUM 40 MG PO TBEC: 40.0000 mg | DELAYED_RELEASE_TABLET | Freq: Every day | ORAL | 3 refills | Status: DC

## 2016-03-12 MED FILL — PANTOPRAZOLE SOD DR 40 MG T: 40 | 90 days supply | Qty: 90 | Fill #0

## 2016-03-12 MED FILL — raNITIdine HCL 150 MG TABS: 150 | 90 days supply | Qty: 90 | Fill #0

## 2016-03-12 NOTE — Patient Instructions (Addendum)
   Call me if you have any new breathing problems before your next appointment.  Remember to take your Protonix/Pantoprazole pill 30 minutes before you eat on an empty stomach.  We may have to refer you to a GI specialist depending on your swallowing test.  I will see you back in 8 weeks or sooner if needed.   TESTS ORDERED: 1. Esophagram/Barium Swallow

## 2016-03-12 NOTE — Progress Notes (Signed)
Test reviewed.  

## 2016-03-12 NOTE — Progress Notes (Signed)
Subjective:    Patient ID: Brooke Bright, female    DOB: Jun 10, 1955, 60 y.o.   MRN: [id number]  C.C.:  Follow-up for Mild, Intermittent Asthma & Allergic Rhinitis.  HPI Mild, Intermittent Asthma:  She reports no significant coughing. Minimal wheezing. Denies any dyspnea above her baseline. No nocturnal awakenings with any breahting problems.   Allergic Rhinitis:  Previously on immunotherapy. She is on OTC Flonase. He reports no sinus pressure, pain, or congestion.   GERD w/ Odynophagia:  She reports she has had more reflux & dyspepsia despite dietary & lifestyle changes. She reports dysphagia and odynophagia at the level of her throat. She reports over the last 3 months she has noticed more symptoms. She did take OTC Prilosec for 2 weeks with some symptom improvement temporarily.   Review of Systems No rashes or bruising. No fever, chills, or sweats. No chest pain, tightness, or pressure.   Allergies  Allergen Reactions  . Sulfonamide Derivatives     Current Outpatient Prescriptions on File Prior to Visit  Medication Sig Dispense Refill  . fluticasone (FLONASE) 50 MCG/ACT nasal spray PLACE 2 SPRAYS INTO THE NOSE AT BEDTIME. (Patient taking differently: PLACE 2 SPRAYS INTO THE NOSE as needed.) 16 g 6  . PROCTOSOL HC 2.5 % rectal cream Apply 1 application topically as needed.  5  . cyclobenzaprine (FLEXERIL) 10 MG tablet Take 10 mg by mouth as needed for muscle spasms.    . meloxicam (MOBIC) 15 MG tablet   0  . pravastatin (PRAVACHOL) 10 MG tablet Take 1 tablet (10 mg total) by mouth daily. (Patient not taking: Reported on 03/12/2016) 90 tablet 1   No current facility-administered medications on file prior to visit.     Past Medical History:  Diagnosis Date  . Arthritis   . Chest pain 07/29/2015  . Headache(784.0)   . Hyperlipidemia   . IBS (irritable bowel syndrome)   . Normal MRI 04/2008   normal MRI of Brain  . Osteopenia   . Palpitations 07/29/2015  . Vitamin D  deficiency    low vitamin D    Past Surgical History:  Procedure Laterality Date  . KNEE ARTHROSCOPY  1994   left knee  . TONSILLECTOMY  1992    Family History  Problem Relation Age of Onset  . Hypertension Mother   . Fibromyalgia Mother   . Osteoarthritis Mother   . Hyperlipidemia Mother   . Graves' disease Paternal Aunt   . Immunodeficiency      auto immune disease-father side of family  . Rheum arthritis    . Healthy Brother   . Heart disease Maternal Grandmother   . Heart failure Maternal Grandmother   . Hyperthyroidism Maternal Grandmother   . Arthritis Maternal Grandmother   . Graves' disease Maternal Grandmother   . Lupus Maternal Uncle   . Lung disease Neg Hx     Social History   Social History  . Marital status: Married    Spouse name: N/A  . Number of children: N/A  . Years of education: N/A   Social History Main Topics  . Smoking status: Never Smoker  . Smokeless tobacco: Never Used  . Alcohol use 0.0 oz/week     Comment: rare glass of wine on weekends  . Drug use: No  . Sexual activity: Not Asked   Other Topics Concern  . 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 She has traveled to [place], [place], & [place]. 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 She has a Nurse, mental healthdog. No bird exposure. No notable mold exposure. Previous hot tub exposure.       Objective:   Physical Exam BP 104/66 (BP Location: Left Arm, Patient Position: Sitting, Cuff Size: Normal)   Pulse 68   Ht 5\' 4"  (1.626 m)   Wt 141 lb (64 kg)   SpO2 97%   BMI 24.20 kg/m  General:  Awake. Alert.  Well-dressed. Integument:  Warm & dry. No rash on exposed skin.  Lymphatics:  No appreciated cervical or supraclavicular lymphadenoapthy. HEENT:  Moist mucus membranes. No nasal turbinate swelling. No scleral icterus. Cardiovascular:  Regular rate. No edema. Normal S1 & S2. Pulmonary:  Speaking in complete sentences. Clear to auscultation with good aeration bilaterally. Normal work of breathing.  PFT 03/12/16: FVC 3.03 L (93%) FEV1 2.47 L (99%) FEV1/FVC 0.81 FEF 25-75 2.53 L (112%) negative bronchodilator response 08/26/15: FVC 3.13 L (96%) FEV1 2.55 L (101%) FEV1/FVC 0.81 FEF 25-75 2.67 L (118%) negative bronchodilator response TLC 4.81 L (95%) RV 84% ERV 46% DLCO uncorrected 93%   IMAGING CXR PA/LAT 09/30/15 (previously reviewed by me): No focal opacity or effusion appreciated. Heart normal in size & mediastinum normal in contour.   CARDIAC TTE (09/08/15): LV normal in size with EF 55-60% no regional wall motion abnormalities. Grade 1 diastolic dysfunction. LA & RA normal in size. RV normal in size and function. No aortic stenosis or regurgitation. Trivial mitral regurgitation without stenosis. No pulmonic stenosis with trivial regurgitation. Trivial tricuspid regurgitation. No pericardial effusion.  14 DAY HOLTER MONITOR (08/21/15): Occasional PVCs & PACs noted with patient noted symptoms. Predominant rhythm sinus.  MYOCARDIAL PERFUSION (08/08/15): EF 51% with normal LV function. No evidence of ischemia.  LABS 09/30/15 CBC: 5.0/13.2/39.1/236  03/03/10 ANA:  Positive (1:160) RF:  <20    Assessment & Plan:  60 y.o. female with mild, intermittent asthma & allergic rhinitis. Patient spirometry today has worsened significantly since previous testing likely due to her ongoing and very severe reflux. With her history of odynophagia and dysphagia I do feel that further imaging is necessary. We did discuss the potential need for referral to GI for possible endoscopy depending upon her swallow test.  Her allergic rhinitis seems to be very well-controlled at this time. I instructed her to contact my office if she had any new questions or breathing problems before her next appointment.  1. Mild, Intermittent Asthma:  Continuing to monitor for symptoms. 2. GERD/Dysphagia/Odynophagia:  Starting Protonix 40mg  po qAM & Zantac 150mg  qhs. Patient counseled on appropriate dietary & lifestyle modifications. Checking esophagram/barium swallow. 3. Allergic Rhinitis:  Continuing Flonase.  4. Health Maintenance:  Influenza Vaccine September 2017.  5. Follow-up: Patient to return to clinic in 8 weeks or sooner if needed.   Donna ChristenJennings E. Jamison NeighborNestor, M.D. [facility] Pulmonary & Critical Care Pager:  [phone] After 3pm or if no response, call [phone] 10:48 AM 03/12/16

## 2016-03-22 ENCOUNTER — Ambulatory Visit (HOSPITAL_COMMUNITY)
Admission: RE | Admit: 2016-03-22 | Discharge: 2016-03-22 | Disposition: A | Discharge status: Home / Self Care | Payer: 59 | Source: Ambulatory Visit | Attending: Pulmonary Disease | Admitting: Pulmonary Disease

## 2016-03-22 DIAGNOSIS — R1310 Dysphagia, unspecified: Secondary | ICD-10-CM | POA: Diagnosis not present

## 2016-03-22 DIAGNOSIS — K219 Gastro-esophageal reflux disease without esophagitis: Secondary | ICD-10-CM | POA: Diagnosis not present

## 2016-03-22 DIAGNOSIS — L820 Inflamed seborrheic keratosis: Secondary | ICD-10-CM | POA: Diagnosis not present

## 2016-03-22 DIAGNOSIS — L814 Other melanin hyperpigmentation: Secondary | ICD-10-CM | POA: Diagnosis not present

## 2016-03-22 DIAGNOSIS — R1319 Other dysphagia: Secondary | ICD-10-CM

## 2016-04-08 MED FILL — CLOTRIMAZOLE-BETAMETHASONE: 1-0.05 | 7 days supply | Qty: 15 | Fill #2

## 2016-04-08 MED FILL — ESTRACE 0.01% CREAM: 0.1 | 90 days supply | Qty: 43 | Fill #1

## 2016-04-19 ENCOUNTER — Ambulatory Visit: Payer: Self-pay | Admitting: Rheumatology

## 2016-05-14 DIAGNOSIS — G4709 Other insomnia: Secondary | ICD-10-CM | POA: Insufficient documentation

## 2016-05-14 DIAGNOSIS — M7918 Myalgia, other site: Secondary | ICD-10-CM | POA: Insufficient documentation

## 2016-05-14 DIAGNOSIS — R768 Other specified abnormal immunological findings in serum: Secondary | ICD-10-CM | POA: Insufficient documentation

## 2016-05-14 NOTE — Progress Notes (Signed)
Office Visit Note  Patient: Brooke Bright             Date of Birth: 08-Jun-1955           MRN: [id number]             PCP: No PCP Per Patient Referring: No ref. provider found Visit Date: 05/18/2016 Occupation: @GUAROCC @    Subjective:  Shoulder pain - bilateral.   History of Present Illness: Brooke Bright is a 60 y.o. female with history of myofascial pain syndrome. She returns after her last visit in May 2017. She states she did not go for physical therapy after her last visit. She's been having pain and stiffness in her neck area and her bilateral shoulders. She states she has difficulty lifting her arms and they pop at times. She's been also having some muscle spasms which has involved her calf muscles and thoracic area. Her hands has been painful as well.  Activities of Daily Living:  Patient reports morning stiffness for 2 hours.   Patient Reports nocturnal pain.  Difficulty dressing/grooming: Denies Difficulty climbing stairs: Denies Difficulty getting out of chair: Denies Difficulty using hands for taps, buttons, cutlery, and/or writing: Reports   Review of Systems  Constitutional: Positive for fatigue. Negative for night sweats, weight gain, weight loss and weakness.  HENT: Positive for mouth dryness. Negative for mouth sores, trouble swallowing, trouble swallowing and nose dryness.   Eyes: Positive for dryness. Negative for pain, redness and visual disturbance.  Respiratory: Negative for cough, shortness of breath and difficulty breathing.   Cardiovascular: Negative for chest pain, palpitations, hypertension, irregular heartbeat and swelling in legs/feet.  Gastrointestinal: Negative for blood in stool, constipation and diarrhea.  Endocrine: Negative for increased urination.  Genitourinary: Negative for vaginal dryness.  Musculoskeletal: Positive for arthralgias, joint pain, myalgias, morning stiffness and myalgias. Negative for joint swelling, muscle weakness and  muscle tenderness.  Skin: Positive for rash. Negative for color change, hair loss, skin tightness, ulcers and sensitivity to sunlight.  Allergic/Immunologic: Negative for susceptible to infections.  Neurological: Negative for dizziness, memory loss and night sweats.  Hematological: Negative for swollen glands.  Psychiatric/Behavioral: Positive for sleep disturbance. Negative for depressed mood. The patient is nervous/anxious.     PMFS History:  Patient Active Problem List   Diagnosis Date Noted  . Chronic fatigue 05/18/2016  . Chronic bilateral shoulder pain 05/18/2016  . Primary osteoarthritis of both hands 05/16/2016  . DDD cervical spine 05/16/2016  . DDD lumbar spine 05/16/2016  . Irritable bowel syndrome  05/16/2016  . Headaches 05/16/2016  . Myofacial muscle pain 05/14/2016  . Other insomnia 05/14/2016  . ANA positive 05/14/2016  . Mild intermittent asthma 03/12/2016  . GERD (gastroesophageal reflux disease) 03/12/2016  . Dysphagia 03/12/2016  . Odynophagia 03/12/2016  . Allergic rhinitis 11/11/2015  . Palpitations 07/29/2015  . Chest pain 07/29/2015  . Acute bronchitis due to other specified organisms 02/17/2014  . Low back pain with right-sided sciatica 02/08/2014  . Muscle pain 08/08/2011  . Ear pain 08/08/2011  . DIVERTICULOSIS, COLON 06/29/2010  . COLONIC POLYPS, HYPERPLASTIC, HX OF 06/29/2010  . FLATULENCE-GAS-BLOATING 05/26/2010  . ARTHRALGIA 02/24/2010  . SHORTNESS OF BREATH 02/24/2010  . DIARRHEA 02/24/2010  . EARLY SATIETY 02/24/2010  . CERUMEN IMPACTION, BILATERAL 08/12/2008  . HYPERLIPIDEMIA 05/21/2008  . ANEMIA, MILD 05/21/2008  . OSTEOPENIA 04/16/2008  . HEADACHE 04/16/2008    Past Medical History:  Diagnosis Date  . Arthritis   . Chest pain 07/29/2015  .  Headache(784.0)   . Hyperlipidemia   . IBS (irritable bowel syndrome)   . Normal MRI 04/2008   normal MRI of Brain  . Osteopenia   . Palpitations 07/29/2015  . Vitamin D deficiency    low  vitamin D    Family History  Problem Relation Age of Onset  . Hypertension Mother   . Fibromyalgia Mother   . Osteoarthritis Mother   . Hyperlipidemia Mother   . Graves' disease Paternal Aunt   . Immunodeficiency      auto immune disease-father side of family  . Rheum arthritis    . Healthy Brother   . Heart disease Maternal Grandmother   . Heart failure Maternal Grandmother   . Hyperthyroidism Maternal Grandmother   . Arthritis Maternal Grandmother   . Graves' disease Maternal Grandmother   . Lupus Maternal Uncle   . Lung disease Neg Hx    Past Surgical History:  Procedure Laterality Date  . KNEE ARTHROSCOPY  1994   left knee  . 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 Previously has lived in [facility] [place] & [place]. She has traveled to [place], [place], & [place]. 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 She has a Nurse, mental health. No bird exposure. No notable mold exposure. Previous hot tub exposure.      Objective: Vital Signs: BP 122/66   Pulse 62   Resp 14   Ht 5\' 4"  (1.626 m)   Wt 144 lb (65.3 kg)   BMI 24.72 kg/m    Physical Exam  Constitutional: She is oriented to person, place, and time. She appears well-developed and well-nourished.  HENT:  Head: Normocephalic and atraumatic.  Eyes: Conjunctivae and EOM are normal.  Neck: Normal range of motion.  Cardiovascular: Normal rate, regular rhythm, normal heart sounds and intact distal pulses.   Pulmonary/Chest: Effort normal and breath sounds normal.  Abdominal: Soft. Bowel sounds are normal.  Lymphadenopathy:    She has no cervical adenopathy.    Neurological: She is alert and oriented to person, place, and time.  Skin: Skin is warm and dry. Capillary refill takes less than 2 seconds.  Psychiatric: She has a normal mood and affect. Her behavior is normal.  Nursing note and vitals reviewed.    Musculoskeletal Exam: C-spine limitation with range of motion. She has some discomfort and limitation with range of motion of her thoracic and lumbar spine. Tenderness over bilateral trapezius area more so on the left trapezius area. She has painful range of motion of bilateral shoulder joints although a were full range of motion. She is good range of motion of her bilateral elbows wrists joint MCPs joints PIP joints she has PIP/DIP Thickening in her hands consistent with osteoarthritis. Hip joints, knee joints, ankle joints MTPs PIPs with good range of motion with no synovitis. She has tenderness on palpation over bilateral trochanteric bursa area consistent with trochanteric bursitis.  CDAI Exam: No CDAI exam completed.    Investigation: Findings:  July  2013 - TSH was normal. CBC was normal. Comprehensive metabolic panel, ANA, C-reactive protein, and vitamin D were normal. We obtained x-ray of bilateral hands, 2 views, AP and oblique, which showed bilateral PIP/DIP narrowing and CMC narrowing without any MCP changes. These findings were consistent with early osteoarthritis. Lumbar spine x-ray, 2 views, AP and lateral, showed L5-S1 narrowing and some facet joint arthropathy consistent with some degenerative changes. Pelvis AP view was obtained which showed normal hip joints. January 2017 MRI of the cervical spine report showed facet joint disease at C3-C4 with intense marrow edema, mild multilevel spondylosis, moderate C5-6 right foraminal narrowing    Imaging: No results found.  Speciality Comments: No specialty comments available.    Procedures:  Trigger Point Inj Date/Time: 05/18/2016 11:26 AM Performed by: Pollyann Savoy Authorized  by: Pollyann Savoy   Consent Given by:  Patient Site marked: the procedure site was marked   Timeout: prior to procedure the correct patient, procedure, and site was verified   Indications:  Muscle spasm and pain Total # of Trigger Points:  1 Location: neck   Needle Size:  27 G Approach:  Dorsal Medications #1:  0.5 mL lidocaine 1 %; 10 mg triamcinolone acetonide 40 MG/ML Patient tolerance:  Patient tolerated the procedure well with no immediate complications Large Joint Inj Date/Time: 05/18/2016 11:27 AM Performed by: Pollyann Savoy Authorized by: Pollyann Savoy   Consent Given by:  Patient Site marked: the procedure site was marked   Timeout: prior to procedure the correct patient, procedure, and site was verified   Indications:  Pain Location:  Hip Site:  R greater trochanter Prep: patient was prepped and draped in usual sterile fashion   Needle Size:  27 G Needle Length:  1.5 inches Approach:  Lateral Ultrasound Guidance: No   Fluoroscopic Guidance: No   Arthrogram: No   Medications:  40 mg triamcinolone acetonide 40 MG/ML; 1.5 mL lidocaine 1 % Aspiration Attempted: No   Aspirate amount (mL):  0 Patient tolerance:  Patient tolerated the procedure well with no immediate complications   Allergies: Sulfonamide derivatives   Assessment / Plan:     Visit Diagnoses: Myofacial muscle pain: She continues to have some generalized pain and discomfort and fatigue.  Insomnia: Insomnia she relates secondary to muscle spasms and generalized pain  Chronic fatigue: Due to and some  Chronic bilateral shoulder pain: She has good range of motion of her exam is consistent with tendinopathy. She could not go for physical therapy last visit after given her prescription in to go for physical therapy.  Trochanteric bursitis of right hip: Different treatment options and their side effects were discussed after informed consent was obtained and it was injected with cortisone  injection as described above. She tolerated the procedure well.  Primary osteoarthritis of both hands: Joint protection and muscle strengthening was discussed.  DDD cervical spine: She does have chronic pain I will refer her to physical therapy.  DDD lumbar spine and thoracic spine. She's been referred to physical therapy. She does have other multiple medical problems for which she's been seeing other physicians. Some are listed as follows: Irritable bowel syndrome  - With gluten sensitivity  Headaches    Orders: Orders Placed This Encounter  Procedures  . Trigger Point Injection  . Large Joint Injection/Arthrocentesis   No orders of the defined types were placed in this encounter.   Face-to-face time spent with patient was 40 minutes. 50% of time was spent in counseling and coordination of care.  Follow-Up Instructions: Return in about 6 months (around 11/16/2016) for Osteoarthritis.   Pollyann SavoyShaili Merlon Alcorta, MD

## 2016-05-16 DIAGNOSIS — K589 Irritable bowel syndrome without diarrhea: Secondary | ICD-10-CM | POA: Insufficient documentation

## 2016-05-16 DIAGNOSIS — M47812 Spondylosis without myelopathy or radiculopathy, cervical region: Secondary | ICD-10-CM | POA: Insufficient documentation

## 2016-05-16 DIAGNOSIS — M19041 Primary osteoarthritis, right hand: Secondary | ICD-10-CM | POA: Insufficient documentation

## 2016-05-16 DIAGNOSIS — G44229 Chronic tension-type headache, not intractable: Secondary | ICD-10-CM | POA: Insufficient documentation

## 2016-05-16 DIAGNOSIS — M19042 Primary osteoarthritis, left hand: Secondary | ICD-10-CM

## 2016-05-16 DIAGNOSIS — M47816 Spondylosis without myelopathy or radiculopathy, lumbar region: Secondary | ICD-10-CM | POA: Insufficient documentation

## 2016-05-17 ENCOUNTER — Encounter: Payer: Self-pay | Admitting: Pulmonary Disease

## 2016-05-17 ENCOUNTER — Ambulatory Visit (INDEPENDENT_AMBULATORY_CARE_PROVIDER_SITE_OTHER): Payer: 59 | Admitting: Pulmonary Disease

## 2016-05-17 VITALS — BP 134/82 | HR 66 | Ht 64.0 in | Wt 143.0 lb

## 2016-05-17 DIAGNOSIS — Z23 Encounter for immunization: Secondary | ICD-10-CM | POA: Diagnosis not present

## 2016-05-17 DIAGNOSIS — K219 Gastro-esophageal reflux disease without esophagitis: Secondary | ICD-10-CM | POA: Diagnosis not present

## 2016-05-17 DIAGNOSIS — J309 Allergic rhinitis, unspecified: Secondary | ICD-10-CM

## 2016-05-17 DIAGNOSIS — J4520 Mild intermittent asthma, uncomplicated: Secondary | ICD-10-CM

## 2016-05-17 NOTE — Progress Notes (Signed)
Subjective:    Patient ID: Brooke Bright, female    DOB: 1955/06/21, 60 y.o.   MRN: [id number]  C.C.:  Follow-up for Mild, Intermittent Asthma & Chronic Allergic Rhinitis.  HPI Mild, Intermittent Asthma:  She reports a minimal cough in the last couple of weeks but previously had essentially resolved. No wheezing. No dyspnea. She reports she does have to clear her throat more at work.   Chronic Allergic Rhinitis:  Previously on immunotherapy. Previously using OTC Flonase. No new sinus congestion, pressure or drainage.   GERD w/ Odynophagia:  Started on Protonix & Zantac at last appointment. Esophagram performed in October showed no hiatal hernia or evidence of reflux. She reports her dysphagia & odynophagia has resolved.   Review of Systems Reports she did previously have chest wall pain with pleurisy & movement that has since resolved. No fever, chills, or sweats. She reports she did wake up with a "muscle cramp" in her left leg in her calf and hamstring with no noticeable swelling that resolved after an hour.  Allergies  Allergen Reactions  . Sulfonamide Derivatives Hives    Current Outpatient Prescriptions on File Prior to Visit  Medication Sig Dispense Refill  . fluticasone (FLONASE) 50 MCG/ACT nasal spray PLACE 2 SPRAYS INTO THE NOSE AT BEDTIME. (Patient taking differently: PLACE 2 SPRAYS INTO THE NOSE as needed.) 16 g 6  . pantoprazole (PROTONIX) 40 MG tablet Take 1 tablet (40 mg total) by mouth daily. 30 tablet 3  . pravastatin (PRAVACHOL) 10 MG tablet Take 1 tablet (10 mg total) by mouth daily. 90 tablet 1  . PROCTOSOL HC 2.5 % rectal cream Apply 1 application topically as needed.  5  . ranitidine (ZANTAC) 150 MG tablet Take 1 tablet (150 mg total) by mouth at bedtime. 30 tablet 3  . meloxicam (MOBIC) 15 MG tablet   0   No current facility-administered medications on file prior to visit.     Past Medical History:  Diagnosis Date  . Arthritis   . Chest pain 07/29/2015  .  Headache(784.0)   . Hyperlipidemia   . IBS (irritable bowel syndrome)   . Normal MRI 04/2008   normal MRI of Brain  . Osteopenia   . Palpitations 07/29/2015  . Vitamin D deficiency    low vitamin D    Past Surgical History:  Procedure Laterality Date  . KNEE ARTHROSCOPY  1994   left knee  . TONSILLECTOMY  1992    Family History  Problem Relation Age of Onset  . Hypertension Mother   . Fibromyalgia Mother   . Osteoarthritis Mother   . Hyperlipidemia Mother   . Graves' disease Paternal Aunt   . Immunodeficiency      auto immune disease-father side of family  . Rheum arthritis    . Healthy Brother   . Heart disease Maternal Grandmother   . Heart failure Maternal Grandmother   . Hyperthyroidism Maternal Grandmother   . Arthritis Maternal Grandmother   . Graves' disease Maternal Grandmother   . Lupus Maternal Uncle   . Lung disease Neg Hx     Social History   Social History  . Marital status: Married    Spouse name: N/A  . Number of children: N/A  . Years of education: N/A   Social History Main Topics  . Smoking status: Never Smoker  . Smokeless tobacco: Never Used  . Alcohol use 0.0 oz/week     Comment: rare glass of wine on weekends  . Drug  use: No  . Sexual activity: Not Asked   Other Topics Concern  . 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 [place] She has traveled to [place], [place], & [place]. 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 She has a Nurse, mental healthdog. No bird exposure. No notable mold exposure. Previous hot tub exposure.       Objective:   Physical Exam BP 134/82 (BP Location:  Left Arm, Cuff Size: Normal)   Pulse 66   Ht 5\' 4"  (1.626 m)   Wt 143 lb (64.9 kg)   SpO2 97%   BMI 24.55 kg/m  General:  Awake. Alert. Well-dressed.Caucasian female. Integument:  Warm & dry. No rash on exposed skin.  Lymphatics:  No appreciated cervical or supraclavicular lymphadenoapthy. HEENT:  Moist mucus membranes. No scleral injection. Cardiovascular:  Regular rate. No edema. Normal S1 & S2. Pulmonary:  Clear to auscultation. Normal work of breathing on room air. Speaking in complete sentences. Abdomen: Soft. Nondistended. Nontender.  PFT 03/12/16: FVC 3.03 L (93%) FEV1 2.47 L (99%) FEV1/FVC 0.81 FEF 25-75 2.53 L (112%) negative bronchodilator response 08/26/15: FVC 3.13 L (96%) FEV1 2.55 L (101%) FEV1/FVC 0.81 FEF 25-75 2.67 L (118%) negative bronchodilator response TLC 4.81 L (95%) RV 84% ERV 46% DLCO uncorrected 93%   IMAGING Esophagram 03/22/16 (per radiologist): Negative. Contrast esophagram. No hiatal hernia.  CXR PA/LAT 09/30/15 (previously reviewed by me): No focal opacity or effusion appreciated. Heart normal in size & mediastinum normal in contour.   CARDIAC TTE (09/08/15): LV normal in size with EF 55-60% no regional wall motion abnormalities. Grade 1 diastolic dysfunction. LA & RA normal in size. RV normal in size and function. No aortic stenosis or regurgitation. Trivial mitral regurgitation without stenosis. No pulmonic stenosis with trivial regurgitation. Trivial tricuspid regurgitation. No pericardial effusion.  14 DAY HOLTER MONITOR (08/21/15): Occasional PVCs & PACs noted with patient noted symptoms. Predominant rhythm sinus.  MYOCARDIAL PERFUSION (08/08/15): EF 51% with normal LV function. No evidence of ischemia.  LABS 09/30/15 CBC: 5.0/13.2/39.1/236  03/03/10 ANA:  Positive (1:160) RF:  <20    Assessment & Plan:  60 y.o. female with mild, intermittent asthma, GERD, & chronic allergic rhinitis. Patient has had significant improvement in her asthma control  as well as reflux with her current regimen of Protonix and Zantac. Given the potential negative long-term consequences and risks with photon pump inhibitor use we will try to consolidate to Zantac alone. Her esophagram shows no evidence of stricture or mass. I instructed the patient contact my office if she had any new problems with her breathing before next appointment as I would be happy to see her sooner.  1. Mild, Intermittent Asthma:  Continuing symptomatic monitoring. No new inhalers at this time. 2. GERD/Dysphagia/Odynophagia:  Stopping Protonix. Continuing Zantac by mouth daily at bedtime. 3. Chronic Allergic Rhinitis:  Continuing Flonase. Well controlled. 4. Health Maintenance:  S/P Influenza Vaccine September 2017. Administering Pneumovax 23 today. 5. Follow-up: Patient to return to  clinic in 6 months or sooner if needed.  Donna Christen Jamison Neighbor, M.D. [facility] Pager:  [phone] After 3pm or if no response, call [phone] 11:36 AM 05/17/16

## 2016-05-17 NOTE — Patient Instructions (Signed)
   Continue using the Zantac at night.  Stop using your Protonix. If your symptoms recur go back on it and let me know.  Call me if you have any pain or swelling in your legs.  I will see you back in 6 months or sooner if needed.

## 2016-05-17 NOTE — Addendum Note (Signed)
Addended by: Velvet BatheAULFIELD, Pamila Mendibles L on: 05/17/2016 12:10 PM   Modules accepted: Orders

## 2016-05-18 ENCOUNTER — Other Ambulatory Visit: Payer: Self-pay | Admitting: Rheumatology

## 2016-05-18 ENCOUNTER — Encounter: Payer: Self-pay | Admitting: Rheumatology

## 2016-05-18 ENCOUNTER — Ambulatory Visit (INDEPENDENT_AMBULATORY_CARE_PROVIDER_SITE_OTHER): Payer: 59 | Admitting: Rheumatology

## 2016-05-18 VITALS — BP 122/66 | HR 62 | Resp 14 | Ht 64.0 in | Wt 144.0 lb

## 2016-05-18 DIAGNOSIS — G44229 Chronic tension-type headache, not intractable: Secondary | ICD-10-CM

## 2016-05-18 DIAGNOSIS — M47816 Spondylosis without myelopathy or radiculopathy, lumbar region: Secondary | ICD-10-CM

## 2016-05-18 DIAGNOSIS — G4709 Other insomnia: Secondary | ICD-10-CM | POA: Diagnosis not present

## 2016-05-18 DIAGNOSIS — M5030 Other cervical disc degeneration, unspecified cervical region: Secondary | ICD-10-CM | POA: Diagnosis not present

## 2016-05-18 DIAGNOSIS — M19041 Primary osteoarthritis, right hand: Secondary | ICD-10-CM | POA: Diagnosis not present

## 2016-05-18 DIAGNOSIS — M7061 Trochanteric bursitis, right hip: Secondary | ICD-10-CM | POA: Diagnosis not present

## 2016-05-18 DIAGNOSIS — M791 Myalgia: Secondary | ICD-10-CM

## 2016-05-18 DIAGNOSIS — Z5181 Encounter for therapeutic drug level monitoring: Secondary | ICD-10-CM

## 2016-05-18 DIAGNOSIS — G8929 Other chronic pain: Secondary | ICD-10-CM | POA: Diagnosis not present

## 2016-05-18 DIAGNOSIS — M19042 Primary osteoarthritis, left hand: Secondary | ICD-10-CM

## 2016-05-18 DIAGNOSIS — R5382 Chronic fatigue, unspecified: Secondary | ICD-10-CM | POA: Diagnosis not present

## 2016-05-18 DIAGNOSIS — R0602 Shortness of breath: Secondary | ICD-10-CM

## 2016-05-18 DIAGNOSIS — M25512 Pain in left shoulder: Secondary | ICD-10-CM | POA: Diagnosis not present

## 2016-05-18 DIAGNOSIS — M7918 Myalgia, other site: Secondary | ICD-10-CM

## 2016-05-18 DIAGNOSIS — K589 Irritable bowel syndrome without diarrhea: Secondary | ICD-10-CM | POA: Diagnosis not present

## 2016-05-18 DIAGNOSIS — M47812 Spondylosis without myelopathy or radiculopathy, cervical region: Secondary | ICD-10-CM

## 2016-05-18 LAB — CBC WITH DIFFERENTIAL/PLATELET
Basophils Absolute: 0 cells/uL (ref 0–200)
Basophils Relative: 0 %
EOS ABS: 52 {cells}/uL (ref 15–500)
Eosinophils Relative: 1 %
HEMATOCRIT: 39.5 % (ref 35.0–45.0)
HEMOGLOBIN: 13 g/dL (ref 11.7–15.5)
LYMPHS ABS: 1872 {cells}/uL (ref 850–3900)
Lymphocytes Relative: 36 %
MCH: 32.2 pg (ref 27.0–33.0)
MCHC: 32.9 g/dL (ref 32.0–36.0)
MCV: 97.8 fL (ref 80.0–100.0)
MONO ABS: 572 {cells}/uL (ref 200–950)
MPV: 10.9 fL (ref 7.5–12.5)
Monocytes Relative: 11 %
NEUTROS PCT: 52 %
Neutro Abs: 2704 cells/uL (ref 1500–7800)
Platelets: 246 10*3/uL (ref 140–400)
RBC: 4.04 MIL/uL (ref 3.80–5.10)
RDW: 13 % (ref 11.0–15.0)
WBC: 5.2 10*3/uL (ref 3.8–10.8)

## 2016-05-18 MED ORDER — LIDOCAINE HCL 1 % IJ SOLN
0.5000 mL | INTRAMUSCULAR | Status: AC | PRN
  Administered 2016-05-18: .5 mL

## 2016-05-18 MED ORDER — MELOXICAM 7.5 MG PO TABS: 7.5000 mg | ORAL_TABLET | Freq: Every day | ORAL | 2 refills | Status: AC

## 2016-05-18 MED ORDER — METHOCARBAMOL 500 MG PO TABS: 500.0000 mg | ORAL_TABLET | Freq: Every evening | ORAL | 2 refills | Status: AC | PRN

## 2016-05-18 MED ORDER — TRIAMCINOLONE ACETONIDE 40 MG/ML IJ SUSP
40.0000 mg | INTRAMUSCULAR | Status: AC | PRN
Start: 2016-05-18 — End: 2016-05-18
  Administered 2016-05-18: 40 mg via INTRA_ARTICULAR

## 2016-05-18 MED ORDER — LIDOCAINE HCL 1 % IJ SOLN
1.5000 mL | INTRAMUSCULAR | Status: AC | PRN
  Administered 2016-05-18: 1.5 mL

## 2016-05-18 MED ORDER — TRIAMCINOLONE ACETONIDE 40 MG/ML IJ SUSP
10.0000 mg | INTRAMUSCULAR | Status: AC | PRN
  Administered 2016-05-18: 10 mg via INTRAMUSCULAR

## 2016-05-18 MED FILL — MELOXICAM 7.5 MG TABLET: 7.5 | 30 days supply | Qty: 30 | Fill #0

## 2016-05-18 MED FILL — METHOCARBAMOL 500 MG TABLET: 500 | 30 days supply | Qty: 30 | Fill #0

## 2016-05-19 LAB — COMPLETE METABOLIC PANEL WITH GFR
ALBUMIN: 4.4 g/dL (ref 3.6–5.1)
ALT: 14 U/L (ref 6–29)
AST: 21 U/L (ref 10–35)
Alkaline Phosphatase: 58 U/L (ref 33–130)
BUN: 12 mg/dL (ref 7–25)
CHLORIDE: 103 mmol/L (ref 98–110)
CO2: 27 mmol/L (ref 20–31)
Calcium: 9.4 mg/dL (ref 8.6–10.4)
Creat: 0.63 mg/dL (ref 0.50–0.99)
GFR, Est African American: >89 mL/min (ref >=60)
GFR, Est Non African American: >89 mL/min (ref >=60)
GLUCOSE: 95 mg/dL (ref 65–99)
POTASSIUM: 4.1 mmol/L (ref 3.5–5.3)
SODIUM: 140 mmol/L (ref 135–146)
Total Bilirubin: 0.6 mg/dL (ref 0.2–1.2)
Total Protein: 6.6 g/dL (ref 6.1–8.1)

## 2016-05-19 NOTE — Progress Notes (Signed)
I left a message on answering machine for patient to call back. Her labs are all unremarkable except ANA is very low titer which is nonsignificant. We can add ENA C3 and C4 to complete the workup.

## 2016-05-21 LAB — CP5000020 ENA PANEL
DS DNA AB: 1 [IU]/mL
ENA SM AB SER-ACNC: NEGATIVE
RIBONUCLEIC PROTEIN(ENA) ANTIBODY, IGG: NEGATIVE
SSA (Ro) (ENA) Antibody, IgG: <1
SSB (La) (ENA) Antibody, IgG: <1
Scleroderma (Scl-70) (ENA) Antibody, IgG: <1

## 2016-05-21 LAB — C3 AND C4
C3 COMPLEMENT: 145 mg/dL (ref 90–180)
C4 COMPLEMENT: 24 mg/dL (ref 16–47)

## 2016-05-21 NOTE — Progress Notes (Signed)
Labs normal.

## 2016-05-26 ENCOUNTER — Ambulatory Visit: Payer: 59 | Attending: Rheumatology | Admitting: Physical Therapy

## 2016-05-26 DIAGNOSIS — G8929 Other chronic pain: Secondary | ICD-10-CM | POA: Insufficient documentation

## 2016-05-26 DIAGNOSIS — R293 Abnormal posture: Secondary | ICD-10-CM | POA: Diagnosis not present

## 2016-05-26 DIAGNOSIS — M25512 Pain in left shoulder: Secondary | ICD-10-CM | POA: Insufficient documentation

## 2016-05-26 NOTE — Patient Instructions (Signed)
Pectoral Stretch    With arms behind doorjamb, gently lean forward. Stretch is felt across chest. Hold __30__ seconds. Repeat __3__ times.  Low/Mid/High    Scapular Retraction (Standing)    With arms at sides, pinch shoulder blades together. Repeat __15__ times per set.  Hold 5 seconds.     Axial Extension (Chin Tuck)    Pull chin in and lengthen back of neck. Hold __5__ seconds while counting out loud. Repeat __15__ times.

## 2016-05-27 NOTE — Therapy (Signed)
[facility] [address]  [address] [place], [place], [address] Phone: [phone]   Fax:  [phone]  Physical Therapy Evaluation  Patient Details  Name: Brooke Bright MRN: [id number] Date of Birth: 08/23/1955 Referring Provider: Dr. Pollyann Savoy  Encounter Date: 05/26/2016      PT End of Session - 05/27/16 0751    Visit Number 1   Number of Visits 12   Date for PT Re-Evaluation 07/08/16   PT Start Time 1700   PT Stop Time 1743   PT Time Calculation (min) 43 min   Activity Tolerance Patient tolerated treatment well   Behavior During Therapy [facility] for tasks assessed/performed      Past Medical History:  Diagnosis Date  . Arthritis   . Chest pain 07/29/2015  . Headache(784.0)   . Hyperlipidemia   . IBS (irritable bowel syndrome)   . Normal MRI 04/2008   normal MRI of Brain  . Osteopenia   . Palpitations 07/29/2015  . Vitamin D deficiency    low vitamin D    Past Surgical History:  Procedure Laterality Date  . KNEE ARTHROSCOPY  1994   left knee  . TONSILLECTOMY  1992    There were no vitals filed for this visit.       Subjective Assessment - 05/26/16 1702    Subjective Patient reporting L shoulder/arm/neck area pain. Approx. 3 weeks ago - symptoms seemed to flare up. Patient experience intermittent sharp pains and feelings of "catching." Is currently unable to sleep on L side. Has had cortisone shot (1 week ago) in L UT region - has calmed down symptoms. No known mechanism of injury.    Limitations Lifting;House hold activities   Diagnostic tests none   Patient Stated Goals improve shoulder mobility, reduce pain   Currently in Pain? Yes  average daily pain 2/10   Pain Score 0-No pain   Pain Location Shoulder   Pain Orientation Left   Pain Descriptors / Indicators Burning;Aching;Dull  intermittent sharp   Pain Type Acute pain   Pain Onset 1 to 4 weeks ago   Pain Frequency Intermittent   Aggravating Factors  household chores, repetitive motion, lifting   Pain Relieving Factors rest, ice and heat, pain meds            [facility] PT Assessment - 05/26/16 1709      Assessment   Medical Diagnosis myofascial muscle pain; chronic L shoulder pain; cervical DJD; spondylosis of lumbar region without myelopathy or radiculopathy   Referring Provider Dr. Pollyann Savoy   Onset Date/Surgical Date --  1 month   Hand Dominance Left   Next MD Visit prn   Prior Therapy unsure     Precautions   Precautions None     Balance Screen   Has the patient fallen in the past 6 months No   Has the patient had a decrease in activity level because of a fear of falling?  No   Is the patient reluctant to leave their home because of a fear of falling?  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   Leisure rest     Cognition   Overall Cognitive Status Within Functional Limits for tasks assessed     Observation/Other Assessments  Focus on Therapeutic Outcomes (FOTO)  Shoulder: 54 (46% limited, predicted 35% limited)     ROM / Strength   AROM / PROM / Strength AROM;Strength     AROM   Overall AROM Comments B UE equal and WNL     Strength   Strength Assessment Site Shoulder   Right/Left Shoulder Right;Left   Right Shoulder Flexion 4+/5   Right Shoulder ABduction 4+/5   Right Shoulder Internal Rotation 4+/5   Right Shoulder External Rotation 4-/5   Left Shoulder Flexion 4/5   Left Shoulder ABduction 4/5   Left Shoulder Internal Rotation 4-/5   Left Shoulder External Rotation 3+/5     Palpation   Palpation comment patient tender to deep palpation along L biceps insertion     Special Tests    Special Tests Rotator Cuff Impingement;Biceps/Labral Tests   Rotator Cuff Impingment tests Leanord AsalHawkins- Kennedy test;Lift- off  test;Empty Can test   Biceps/Labral tests Speeds Test     Hawkins-Kennedy test   Findings Positive   Side Left     Lift-Off test   Findings Negative   Side Left     Empty Can test   Findings Negative   Side Left     Speeds test   findings Positive   Side Right   Comment pain and weakness                           PT Education - 05/27/16 0750    Education provided Yes   Education Details exam findings, POC, HEP   Person(s) Educated Patient   Methods Explanation;Demonstration;Handout   Comprehension Verbalized understanding;Returned demonstration             PT Long Term Goals - 05/27/16 0810      PT LONG TERM GOAL #1   Title patient to be independent with HEP (07/08/16)   Status New     PT LONG TERM GOAL #2   Title Patient to demonstrate good postural alignment with ability to self correct for reduced stress/tension placed on neck/upper back musculature (07/08/16)   Status New     PT LONG TERM GOAL #3   Title Patient to improve L shoulder complex strength to >/= 4+/5 with no increase in pain (07/08/16)   Status New     PT LONG TERM GOAL #4   Title Patient to report average daily pain of </= 1/0 for greater than 2 weeks (07/08/16)   Status New               Plan - 05/27/16 0759    Clinical Impression Statement Patient is a 60 y/o female presenting to OPPT for low complexity evaluation regarding L shoulder pain with no known mechanism of injury. Patient with subjective reports of recent cortisone injection in L UT area that has eased pain at this point. Patient today with full AROM of B UE, however with some reduced strength of L shoulder RTC and periscapular mm, as well as abnormal posture. Patient also with some impingement type symptoms with positive provocation with Hawkins-Kennedy testing. Patient to benefit from PT to address the above listed deficits to improve functional use of L UE and to maximize function.    Rehab Potential Good    PT Frequency 2x / week   PT Duration 6 weeks   PT Treatment/Interventions ADLs/Self Care Home Management;Cryotherapy;Electrical Stimulation;Iontophoresis 4mg /ml Dexamethasone;Moist Heat;Ultrasound;Therapeutic exercise;Therapeutic activities;Patient/family education;Manual techniques;Passive range of motion;Vasopneumatic Device;Taping;Dry needling   Consulted and  Agree with Plan of Care Patient      Patient will benefit from skilled therapeutic intervention in order to improve the following deficits and impairments:  Pain, Impaired UE functional use, Decreased strength, Decreased activity tolerance, Increased muscle spasms  Visit Diagnosis: Chronic left shoulder pain - Plan: PT plan of care cert/re-cert  Abnormal posture - Plan: PT plan of care cert/re-cert     Problem List Patient Active Problem List   Diagnosis Date Noted  . Chronic fatigue 05/18/2016  . Chronic bilateral shoulder pain 05/18/2016  . Primary osteoarthritis of both hands 05/16/2016  . DDD cervical spine 05/16/2016  . DDD lumbar spine 05/16/2016  . Irritable bowel syndrome  05/16/2016  . Headaches 05/16/2016  . Myofacial muscle pain 05/14/2016  . Other insomnia 05/14/2016  . ANA positive 05/14/2016  . Mild intermittent asthma 03/12/2016  . GERD (gastroesophageal reflux disease) 03/12/2016  . Dysphagia 03/12/2016  . Odynophagia 03/12/2016  . Allergic rhinitis 11/11/2015  . Palpitations 07/29/2015  . Chest pain 07/29/2015  . Acute bronchitis due to other specified organisms 02/17/2014  . Low back pain with right-sided sciatica 02/08/2014  . Muscle pain 08/08/2011  . Ear pain 08/08/2011  . DIVERTICULOSIS, COLON 06/29/2010  . COLONIC POLYPS, HYPERPLASTIC, HX OF 06/29/2010  . FLATULENCE-GAS-BLOATING 05/26/2010  . ARTHRALGIA 02/24/2010  . SHORTNESS OF BREATH 02/24/2010  . DIARRHEA 02/24/2010  . EARLY SATIETY 02/24/2010  . CERUMEN IMPACTION, BILATERAL 08/12/2008  . HYPERLIPIDEMIA 05/21/2008  . ANEMIA, MILD  05/21/2008  . OSTEOPENIA 04/16/2008  . HEADACHE 04/16/2008      Kipp LaurenceStephanie R Aaron, PT, DPT 05/27/16 8:14 AM   [facility] [address]  [address] [place], [place], [address] Phone: [phone]   Fax:  [phone]  Name: Tito DineDeborah M Steyer MRN: [id number] Date of Birth: 08/05/1955

## 2016-06-02 ENCOUNTER — Ambulatory Visit: Payer: 59 | Admitting: Physical Therapy

## 2016-06-02 DIAGNOSIS — M25512 Pain in left shoulder: Secondary | ICD-10-CM | POA: Diagnosis not present

## 2016-06-02 DIAGNOSIS — G8929 Other chronic pain: Secondary | ICD-10-CM

## 2016-06-02 DIAGNOSIS — R293 Abnormal posture: Secondary | ICD-10-CM

## 2016-06-02 NOTE — Therapy (Signed)
[facility] [address]  [address] [place], [place], [address] Phone: [phone]   Fax:  [phone]  Physical Therapy Treatment  Patient Details  Name: Brooke Bright MRN: [id number] Date of Birth: [birth year] Referring Provider: Dr. Pollyann SavoyShaili Deveshwar  Encounter Date: 06/02/2016      PT End of Session - 06/02/16 1710    Visit Number 2   Number of Visits 12   Date for PT Re-Evaluation 07/08/16   PT Start Time 1703   PT Stop Time 1742   PT Time Calculation (min) 39 min   Activity Tolerance Patient tolerated treatment well   Behavior During Therapy [facility] for tasks assessed/performed      Past Medical History:  Diagnosis Date  . Arthritis   . Chest pain 07/29/2015  . Headache(784.0)   . Hyperlipidemia   . IBS (irritable bowel syndrome)   . Normal MRI 04/2008   normal MRI of Brain  . Osteopenia   . Palpitations 07/29/2015  . Vitamin D deficiency    low vitamin D    Past Surgical History:  Procedure Laterality Date  . KNEE ARTHROSCOPY  1994   left knee  . TONSILLECTOMY  1992    There were no vitals filed for this visit.      Subjective Assessment - 06/02/16 1708    Subjective Patient having some increased pain today - may be from lifting things at work over the past couple of days.    Patient Stated Goals improve shoulder mobility, reduce pain   Currently in Pain? Yes   Pain Score 3    Pain Location Shoulder   Pain Orientation Left   Pain Descriptors / Indicators Aching;Dull   Pain Type Acute pain   Pain Onset 1 to 4 weeks ago   Pain Frequency Intermittent   Aggravating Factors  household chores, repetitive motions, lifting   Pain Relieving Factors rest, ice and heat, pain meds                         [facility] Adult PT Treatment/Exercise - 06/02/16 1712      Exercises   Exercises Shoulder     Shoulder Exercises: Supine   Protraction Strengthening;Both;15 reps;Weights   Protraction  Weight (lbs) 5   Protraction Limitations push up plus in supine; 2 sets     Shoulder Exercises: Prone   Other Prone Exercises I's, T's, Y's on green physioball x 15 reps - no weight     Shoulder Exercises: Standing   External Rotation Strengthening;Left;15 reps;Theraband   Theraband Level (Shoulder External Rotation) Level 2 (Red)   Internal Rotation Strengthening;Left;15 reps;Theraband   Theraband Level (Shoulder Internal Rotation) Level 2 (Red)   Extension Strengthening;Both;15 reps;Theraband   Theraband Level (Shoulder Extension) Level 2 (Red)   Extension Limitations with scap squeeze   Row Strengthening;Both;15 reps;Theraband   Theraband Level (Shoulder Row) Level 2 (Red)   Row Limitations 2 sets; with scap squeeze     Shoulder Exercises: ROM/Strengthening   UBE (Upper Arm Bike) level 2.5 x 6 minutes (3/3)     Shoulder Exercises: Stretch   Other Shoulder Stretches doorway stretch - low/mid/high 3 x 30 seconds     Modalities   Modalities Iontophoresis     Iontophoresis   Type of Iontophoresis Dexamethasone   Location L shoulder; patch #1   Dose 1.0 mL - 80mA    Time 4-6 hours  PT Long Term Goals - 05/27/16 0810      PT LONG TERM GOAL #1   Title patient to be independent with HEP (07/08/16)   Status New     PT LONG TERM GOAL #2   Title Patient to demonstrate good postural alignment with ability to self correct for reduced stress/tension placed on neck/upper back musculature (07/08/16)   Status New     PT LONG TERM GOAL #3   Title Patient to improve L shoulder complex strength to >/= 4+/5 with no increase in pain (07/08/16)   Status New     PT LONG TERM GOAL #4   Title Patient to report average daily pain of </= 1/0 for greater than 2 weeks (07/08/16)   Status New               Plan - 06/02/16 1742    Clinical Impression Statement Patient doing well today - some continued pain of L bicep tendon insertion region. Patient with  subjective report os increased pain with repetitive motion of pronation/supination. Patient did well with all periscpaular strengthening and postural work. PT initiating ionto this session and will assess response at next visit.    PT Treatment/Interventions ADLs/Self Care Home Management;Cryotherapy;Electrical Stimulation;Iontophoresis 4mg /ml Dexamethasone;Moist Heat;Ultrasound;Therapeutic exercise;Therapeutic activities;Patient/family education;Manual techniques;Passive range of motion;Vasopneumatic Device;Taping;Dry needling   PT Next Visit Plan assess response to ionto, periscapular strength   Consulted and Agree with Plan of Care Patient      Patient will benefit from skilled therapeutic intervention in order to improve the following deficits and impairments:  Pain, Impaired UE functional use, Decreased strength, Decreased activity tolerance, Increased muscle spasms  Visit Diagnosis: Chronic left shoulder pain  Abnormal posture     Problem List Patient Active Problem List   Diagnosis Date Noted  . Chronic fatigue 05/18/2016  . Chronic bilateral shoulder pain 05/18/2016  . Primary osteoarthritis of both hands 05/16/2016  . DDD cervical spine 05/16/2016  . DDD lumbar spine 05/16/2016  . Irritable bowel syndrome  05/16/2016  . Headaches 05/16/2016  . Myofacial muscle pain 05/14/2016  . Other insomnia 05/14/2016  . ANA positive 05/14/2016  . Mild intermittent asthma 03/12/2016  . GERD (gastroesophageal reflux disease) 03/12/2016  . Dysphagia 03/12/2016  . Odynophagia 03/12/2016  . Allergic rhinitis 11/11/2015  . Palpitations 07/29/2015  . Chest pain 07/29/2015  . Acute bronchitis due to other specified organisms 02/17/2014  . Low back pain with right-sided sciatica 02/08/2014  . Muscle pain 08/08/2011  . Ear pain 08/08/2011  . DIVERTICULOSIS, COLON 06/29/2010  . COLONIC POLYPS, HYPERPLASTIC, HX OF 06/29/2010  . FLATULENCE-GAS-BLOATING 05/26/2010  . ARTHRALGIA 02/24/2010   . SHORTNESS OF BREATH 02/24/2010  . DIARRHEA 02/24/2010  . EARLY SATIETY 02/24/2010  . CERUMEN IMPACTION, BILATERAL 08/12/2008  . HYPERLIPIDEMIA 05/21/2008  . ANEMIA, MILD 05/21/2008  . OSTEOPENIA 04/16/2008  . HEADACHE 04/16/2008     Kipp LaurenceStephanie R Loye Vento, PT, DPT 06/02/16 5:46 PM   [facility] [address]  [address] [place], [place], [address] Phone: [phone]   Fax:  [phone]  Name: Brooke Bright MRN: [id number] Date of Birth: [birth year]

## 2016-06-02 NOTE — Patient Instructions (Signed)

## 2016-06-09 ENCOUNTER — Ambulatory Visit: Payer: 59 | Attending: Rheumatology | Admitting: Physical Therapy

## 2016-06-09 DIAGNOSIS — M25512 Pain in left shoulder: Secondary | ICD-10-CM | POA: Diagnosis not present

## 2016-06-09 DIAGNOSIS — R293 Abnormal posture: Secondary | ICD-10-CM | POA: Insufficient documentation

## 2016-06-09 DIAGNOSIS — G8929 Other chronic pain: Secondary | ICD-10-CM | POA: Diagnosis not present

## 2016-06-09 NOTE — Patient Instructions (Signed)
Resistive Band Rowing    With resistive band anchored in door, grasp both ends. Keeping elbows bent, pull back, squeezing shoulder blades together. Hold __5__ seconds. Repeat _15___ times.    INTERNAL ROTATION: Sitting - Resistance Band (Active)    Sit, right arm bent to 90, elbow against side, forearm out from body. Against yellow resistance band, rotate arm in to body, keeping elbow at side. Complete __1_ sets of __15_ repetitions.    EXTERNAL ROTATION: Sitting - Resistance Band (Active)    Sit, right arm elbow bent to 90, elbow against side, hand forward. Against yellow resistance band, rotate forearm outward, keeping elbow at side. Rotate forearm outward as far as possible. Complete _1_ sets of _15__ repetitions.

## 2016-06-09 NOTE — Therapy (Addendum)
[facility] [address]  [place], [place], [address] Phone: [phone]   Fax:  [phone]  Physical Therapy Treatment  Patient Details  Name: Brooke Bright MRN: [id number] Date of Birth: 11-18-1955 Referring Provider: Dr. Bo Merino  Encounter Date: 06/09/2016      PT End of Session - 06/09/16 1622    Visit Number 3   Number of Visits 12   Date for PT Re-Evaluation 07/08/16   PT Start Time 5397   PT Stop Time 1656   PT Time Calculation (min) 41 min   Activity Tolerance Patient tolerated treatment well   Behavior During Therapy [facility] for tasks assessed/performed      Past Medical History:  Diagnosis Date  . Arthritis   . Chest pain 07/29/2015  . Headache(784.0)   . Hyperlipidemia   . IBS (irritable bowel syndrome)   . Normal MRI 04/2008   normal MRI of Brain  . Osteopenia   . Palpitations 07/29/2015  . Vitamin D deficiency    low vitamin D    Past Surgical History:  Procedure Laterality Date  . KNEE ARTHROSCOPY  1994   left knee  . TONSILLECTOMY  1992    There were no vitals filed for this visit.      Subjective Assessment - 06/09/16 1618    Subjective Patient feeling better than last session - small twinges of pain   Patient Stated Goals improve shoulder mobility, reduce pain   Currently in Pain? Yes   Pain Score 3   not currently; just twinges of pain   Pain Location Shoulder   Pain Orientation Left   Pain Descriptors / Indicators Dull;Tender   Pain Type Acute pain   Pain Onset 1 to 4 weeks ago   Pain Frequency Intermittent   Aggravating Factors  household chores, repetitive motions, lifting   Pain Relieving Factors rest, ice and heat, pain meds                         [facility] Adult PT Treatment/Exercise - 06/09/16 0001      Shoulder Exercises: Seated   Other Seated Exercises pronation/supination with L UE at 90 degrees flexion with full elbow extension - golf  club with hand close to head of golf club x 15 reps      Shoulder Exercises: Prone   Other Prone Exercises I's, T's, Y's on green physioball x 15 reps - 1#     Shoulder Exercises: Standing   External Rotation Strengthening;Left;15 reps;Theraband   Theraband Level (Shoulder External Rotation) Level 3 (Green)   Internal Rotation Strengthening;Left;15 reps;Theraband   Theraband Level (Shoulder Internal Rotation) Level 2 (Red)   Extension Strengthening;Both;15 reps;Theraband   Theraband Level (Shoulder Extension) Level 3 (Green)   Extension Limitations with scap squeeze   Row Strengthening;Both;15 reps;Theraband   Theraband Level (Shoulder Row) Level 3 (Green)   Row Limitations with scap squeeze   Other Standing Exercises PNF D1/D2 flexion/extension with green tband x 15 reps     Shoulder Exercises: ROM/Strengthening   UBE (Upper Arm Bike) level 3 x 6 minutes (3/3)   Rhythmic Stabilization, Supine patient supine with yellow medball - CW/CCW circles x 20; ABCs 1 set     Shoulder Exercises: Stretch   Other Shoulder Stretches doorway stretch - low/mid/high 3 x 30 seconds (high terminated due to hand numbness)  PT Long Term Goals - 05/27/16 0810      PT LONG TERM GOAL #1   Title patient to be independent with HEP (07/08/16)   Status New     PT LONG TERM GOAL #2   Title Patient to demonstrate good postural alignment with ability to self correct for reduced stress/tension placed on neck/upper back musculature (07/08/16)   Status New     PT LONG TERM GOAL #3   Title Patient to improve L shoulder complex strength to >/= 4+/5 with no increase in pain (07/08/16)   Status New     PT LONG TERM GOAL #4   Title Patient to report average daily pain of </= 1/0 for greater than 2 weeks (07/08/16)   Status New               Plan - 06/09/16 1658    Clinical Impression Statement Patient feeling well today - has been having less pain since last treatment  sessions, but does notice pain with repetitive motions such as supination/pronation at work. Patient tolerating all RTC and perscapular strengthening with no increase in pain, only slight numbness of L hand with overhead pec stretching, which was terminated due to these subjective reports. Patient feeling well after using ionto after last session, and will consider further use if pain begins to flare up. Patient to continue to benefit form PT to maximize function.    PT Treatment/Interventions ADLs/Self Care Home Management;Cryotherapy;Electrical Stimulation;Iontophoresis 76m/ml Dexamethasone;Moist Heat;Ultrasound;Therapeutic exercise;Therapeutic activities;Patient/family education;Manual techniques;Passive range of motion;Vasopneumatic Device;Taping;Dry needling   PT Next Visit Plan periscapular strength   Consulted and Agree with Plan of Care Patient      Patient will benefit from skilled therapeutic intervention in order to improve the following deficits and impairments:  Pain, Impaired UE functional use, Decreased strength, Decreased activity tolerance, Increased muscle spasms  Visit Diagnosis: Chronic left shoulder pain  Abnormal posture     Problem List Patient Active Problem List   Diagnosis Date Noted  . Chronic fatigue 05/18/2016  . Chronic bilateral shoulder pain 05/18/2016  . Primary osteoarthritis of both hands 05/16/2016  . DDD cervical spine 05/16/2016  . DDD lumbar spine 05/16/2016  . Irritable bowel syndrome  05/16/2016  . Headaches 05/16/2016  . Myofacial muscle pain 05/14/2016  . Other insomnia 05/14/2016  . ANA positive 05/14/2016  . Mild intermittent asthma 03/12/2016  . GERD (gastroesophageal reflux disease) 03/12/2016  . Dysphagia 03/12/2016  . Odynophagia 03/12/2016  . Allergic rhinitis 11/11/2015  . Palpitations 07/29/2015  . Chest pain 07/29/2015  . Acute bronchitis due to other specified organisms 02/17/2014  . Low back pain with right-sided sciatica  02/08/2014  . Muscle pain 08/08/2011  . Ear pain 08/08/2011  . DIVERTICULOSIS, COLON 06/29/2010  . COLONIC POLYPS, HYPERPLASTIC, HX OF 06/29/2010  . FLATULENCE-GAS-BLOATING 05/26/2010  . ARTHRALGIA 02/24/2010  . SHORTNESS OF BREATH 02/24/2010  . DIARRHEA 02/24/2010  . EARLY SATIETY 02/24/2010  . CERUMEN IMPACTION, BILATERAL 08/12/2008  . HYPERLIPIDEMIA 05/21/2008  . ANEMIA, MILD 05/21/2008  . OSTEOPENIA 04/16/2008  . HEADACHE 04/16/2008    SLanney Gins PT, DPT 06/09/16 5:04 PM  PHYSICAL THERAPY DISCHARGE SUMMARY  Visits from Start of Care: 3  Current functional level related to goals / functional outcomes: See above   Remaining deficits: See above; pain continued with repetitive motions   Education / Equipment: HEP  Plan: Patient agrees to discharge.  Patient goals were not met. Patient is being discharged due to not returning since the last visit.  ?????  Patient not returning due to mother being sick and needing to be out of town - will require new referral if patient wished to return to PT.  Lanney Gins, PT, DPT 08/02/16 8:42 AM  [facility] [address]  [place], [place], [address] Phone: [phone]   Fax:  [phone]  Name: Brooke Bright MRN: [id number] Date of Birth: 1955/06/20

## 2016-06-14 ENCOUNTER — Ambulatory Visit: Payer: 59

## 2016-06-17 ENCOUNTER — Ambulatory Visit: Payer: 59 | Admitting: Physical Therapy

## 2016-06-22 ENCOUNTER — Ambulatory Visit: Payer: 59

## 2016-06-24 ENCOUNTER — Ambulatory Visit: Payer: 59 | Admitting: Physical Therapy

## 2016-06-29 ENCOUNTER — Ambulatory Visit: Payer: 59 | Admitting: Physical Therapy

## 2016-07-01 ENCOUNTER — Ambulatory Visit: Payer: 59

## 2016-07-05 ENCOUNTER — Ambulatory Visit: Payer: 59 | Admitting: Physical Therapy

## 2016-07-08 ENCOUNTER — Ambulatory Visit: Payer: 59

## 2016-07-15 DIAGNOSIS — Z1231 Encounter for screening mammogram for malignant neoplasm of breast: Secondary | ICD-10-CM | POA: Diagnosis not present

## 2016-07-15 DIAGNOSIS — Z6824 Body mass index (BMI) 24.0-24.9, adult: Secondary | ICD-10-CM | POA: Diagnosis not present

## 2016-07-15 DIAGNOSIS — Z01419 Encounter for gynecological examination (general) (routine) without abnormal findings: Secondary | ICD-10-CM | POA: Diagnosis not present

## 2016-07-15 MED FILL — ESTRADIOL 0.1 MG/GM CRM: 0.1 | 30 days supply | Qty: 43 | Fill #0

## 2016-07-16 MED FILL — clonazePAM 0.5 MG TABS: 0.5 | 30 days supply | Qty: 30 | Fill #0

## 2016-09-08 MED FILL — MELOXICAM 7.5 MG TABLET: 7.5 | 30 days supply | Qty: 30 | Fill #1

## 2016-09-08 MED FILL — METHOCARBAMOL 500 MG TABLET: 500 | 30 days supply | Qty: 30 | Fill #1

## 2016-09-21 MED FILL — tiZANidine HCL 4 MG TABS: 4 | 30 days supply | Qty: 30 | Fill #1

## 2016-09-21 MED FILL — raNITIdine HCL 150 MG TABS: 150 | 30 days supply | Qty: 30 | Fill #1

## 2016-10-07 MED FILL — PANTOPRAZOLE SOD DR 40 MG T: 40 | 30 days supply | Qty: 30 | Fill #1

## 2016-11-04 MED FILL — MELOXICAM 7.5 MG TABLET: 7.5 | 30 days supply | Qty: 30 | Fill #2

## 2016-11-04 MED FILL — METHOCARBAMOL 500 MG TABLET: 500 | 30 days supply | Qty: 30 | Fill #2

## 2016-11-04 MED FILL — ESTRADIOL 0.1 MG/GM CRM: 0.1 | 30 days supply | Qty: 43 | Fill #1

## 2016-11-04 MED FILL — clonazePAM 0.5 MG TABS: 0.5 | 30 days supply | Qty: 30 | Fill #1

## 2016-11-15 NOTE — Progress Notes (Deleted)
Office Visit Note  Patient: Brooke Bright             Date of Birth: Sep 09, 1955           MRN: [id number]             PCP: Patient, No Pcp Per Referring: No ref. provider found Visit Date: 11/16/2016 Occupation: @GUAROCC @    Subjective:  No chief complaint on file.   History of Present Illness: Brooke Bright is a 61 y.o. female  Last seen in our office on 05/18/2016 and May 2017.  She has a history of myofascial pain syndrome. On the December 2017 visit, she received a cortisone injection to right trochanteric bursa; and trigger point injection to trapezius muscles.  At last visit, we did an autoimmune workup which showed unremarkable labs except for ANA positive with a very low titer which is nonsignificant.  Today,***  Activities of Daily Living:  Patient reports morning stiffness for *** {minute/hour:19697}.   Patient {ACTIONS;DENIES/REPORTS:21021675::"Denies"} nocturnal pain.  Difficulty dressing/grooming: {ACTIONS;DENIES/REPORTS:21021675::"Denies"} Difficulty climbing stairs: {ACTIONS;DENIES/REPORTS:21021675::"Denies"} Difficulty getting out of chair: {ACTIONS;DENIES/REPORTS:21021675::"Denies"} Difficulty using hands for taps, buttons, cutlery, and/or writing: {ACTIONS;DENIES/REPORTS:21021675::"Denies"}   Review of Systems  Constitutional: Positive for fatigue.  HENT: Negative for mouth sores and mouth dryness.   Eyes: Negative for dryness.  Respiratory: Negative for shortness of breath.   Gastrointestinal: Negative for constipation and diarrhea.  Musculoskeletal: Positive for myalgias and myalgias.  Skin: Negative for sensitivity to sunlight.  Psychiatric/Behavioral: Positive for sleep disturbance. Negative for decreased concentration.    PMFS History:  Patient Active Problem List   Diagnosis Date Noted  . Chronic fatigue 05/18/2016  . Chronic bilateral shoulder pain 05/18/2016  . Primary osteoarthritis of both hands 05/16/2016  . DDD cervical spine  05/16/2016  . DDD lumbar spine 05/16/2016  . Irritable bowel syndrome  05/16/2016  . Headaches 05/16/2016  . Myofacial muscle pain 05/14/2016  . Other insomnia 05/14/2016  . ANA positive 05/14/2016  . Mild intermittent asthma 03/12/2016  . GERD (gastroesophageal reflux disease) 03/12/2016  . Dysphagia 03/12/2016  . Odynophagia 03/12/2016  . Allergic rhinitis 11/11/2015  . Palpitations 07/29/2015  . Chest pain 07/29/2015  . Acute bronchitis due to other specified organisms 02/17/2014  . Low back pain with right-sided sciatica 02/08/2014  . Muscle pain 08/08/2011  . Ear pain 08/08/2011  . DIVERTICULOSIS, COLON 06/29/2010  . COLONIC POLYPS, HYPERPLASTIC, HX OF 06/29/2010  . FLATULENCE-GAS-BLOATING 05/26/2010  . ARTHRALGIA 02/24/2010  . SHORTNESS OF BREATH 02/24/2010  . DIARRHEA 02/24/2010  . EARLY SATIETY 02/24/2010  . CERUMEN IMPACTION, BILATERAL 08/12/2008  . HYPERLIPIDEMIA 05/21/2008  . ANEMIA, MILD 05/21/2008  . OSTEOPENIA 04/16/2008  . HEADACHE 04/16/2008    Past Medical History:  Diagnosis Date  . Arthritis   . Chest pain 07/29/2015  . Headache(784.0)   . Hyperlipidemia   . IBS (irritable bowel syndrome)   . Normal MRI 04/2008   normal MRI of Brain  . Osteopenia   . Palpitations 07/29/2015  . Vitamin D deficiency    low vitamin D    Family History  Problem Relation Age of Onset  . Hypertension Mother   . Fibromyalgia Mother   . Osteoarthritis Mother   . Hyperlipidemia Mother   . Graves' disease Paternal Aunt   . Immunodeficiency Unknown        auto immune disease-father side of family  . Rheum arthritis Unknown   . Healthy Brother   . Heart disease Maternal Grandmother   . Heart  failure Maternal Grandmother   . Hyperthyroidism Maternal Grandmother   . Arthritis Maternal Grandmother   . Graves' disease Maternal Grandmother   . Lupus Maternal Uncle   . Lung disease Neg Hx    Past Surgical History:  Procedure Laterality Date  . KNEE ARTHROSCOPY  1994    left knee  . 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 Previously has lived in [facility] & [place]. She has traveled to [place], [place], & [place]. 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 She has a Nurse, mental health. No bird exposure. No notable mold exposure. Previous hot tub exposure.      Objective: Vital Signs: There were no vitals taken for this visit.   Physical Exam  Constitutional: She is oriented to person, place, and time. She appears well-developed and well-nourished.  HENT:  Head: Normocephalic and atraumatic.  Eyes: EOM are normal. Pupils are equal, round, and reactive to light.  Cardiovascular: Normal rate, regular rhythm and normal heart sounds.  Exam reveals no gallop and no friction rub.   No murmur heard. Pulmonary/Chest: Effort normal and breath sounds normal. She has no wheezes. She has no rales.  Abdominal: Soft. Bowel sounds are normal. She exhibits no distension. There is no tenderness. There is no guarding. No hernia.  Musculoskeletal: Normal range of motion. She exhibits no edema, tenderness or deformity.  Lymphadenopathy:    She has no cervical adenopathy.  Neurological: She is alert and oriented to person, place, and time. Coordination normal.  Skin: Skin is warm and dry. Capillary refill takes less than 2 seconds. No rash noted.  Psychiatric: She has a normal mood and affect. Her behavior is normal.  Nursing note and vitals reviewed.    Musculoskeletal Exam: ***  CDAI Exam: No CDAI exam completed.    Investigation: No additional findings. No visits with results within 6 Month(s) from this  visit.  Latest known visit with results is:  Orders Only on 05/18/2016  Component Date Value Ref Range Status  . C3 Complement 05/18/2016 145  90 - 180 mg/dL Final  . C4 Complement 05/18/2016 24  16 - 47 mg/dL Final  . ds DNA Ab 82/95/6213 1  IU/mL Final   Comment:                                 IU/mL       Interpretation                               < or = 4    Negative                               5-9         Indeterminate                               >  or = 10   Positive     . Ribonucleic Protein(ENA) Antibody,* 05/18/2016 <1.0 NEG  <1.0 NEG AI Final  . Scleroderma (Scl-70) (ENA) Antibod* 05/18/2016 <1.0 NEG  <1.0 NEG AI Final  . SSA (Ro) (ENA) Antibody, IgG 05/18/2016 <1.0 NEG  <1.0 NEG AI Final  . SSB (La) (ENA) Antibody, IgG 05/18/2016 <1.0 NEG  <1.0 NEG AI Final  . ENA SM Ab Ser-aCnc 05/18/2016 <1.0 NEG  <1.0 NEG AI Final     Imaging: No results found.  Speciality Comments: No specialty comments available.    Procedures:  No procedures performed Allergies: Sulfonamide derivatives   Assessment / Plan:     Visit Diagnoses: Myofacial muscle pain  Chronic fatigue  Primary osteoarthritis of both hands  DDD lumbar spine  DDD cervical spine    Orders: No orders of the defined types were placed in this encounter.  No orders of the defined types were placed in this encounter.   Face-to-face time spent with patient was 30 minutes. 50% of time was spent in counseling and coordination of care.  Follow-Up Instructions: No Follow-up on file.   Tawni Pummel, PA-C  Note - This record has been created using [employer].  Chart creation errors have been sought, but may not always  have been located. Such creation errors do not reflect on  the standard of medical care.

## 2016-11-16 ENCOUNTER — Ambulatory Visit: Payer: 59 | Admitting: Rheumatology
